# Patient Record
Sex: Male | Born: 1940 | Race: Black or African American | Hispanic: No | Marital: Married | Smoking: Former smoker
Health system: Southern US, Community
[De-identification: ages and names within clinical notes are randomized; demographics above are authoritative.]

## PROBLEM LIST (undated history)

## (undated) DIAGNOSIS — R29898 Other symptoms and signs involving the musculoskeletal system: Secondary | ICD-10-CM

## (undated) DIAGNOSIS — G459 Transient cerebral ischemic attack, unspecified: Secondary | ICD-10-CM

## (undated) DIAGNOSIS — F0280 Dementia in other diseases classified elsewhere without behavioral disturbance: Secondary | ICD-10-CM

## (undated) DIAGNOSIS — R32 Unspecified urinary incontinence: Secondary | ICD-10-CM

## (undated) DIAGNOSIS — F32A Depression, unspecified: Secondary | ICD-10-CM

## (undated) DIAGNOSIS — K219 Gastro-esophageal reflux disease without esophagitis: Secondary | ICD-10-CM

## (undated) DIAGNOSIS — H269 Unspecified cataract: Secondary | ICD-10-CM

## (undated) DIAGNOSIS — I10 Essential (primary) hypertension: Secondary | ICD-10-CM

## (undated) HISTORY — DX: Depression, unspecified: F32.A

## (undated) HISTORY — DX: Transient cerebral ischemic attack, unspecified: G45.9

## (undated) HISTORY — PX: CIRCUMCISION: SUR203

## (undated) HISTORY — PX: OTHER SURGICAL HISTORY: SHX169

## (undated) HISTORY — DX: Unspecified cataract: H26.9

## (undated) HISTORY — DX: Other symptoms and signs involving the musculoskeletal system: R29.898

---

## 2021-02-19 ENCOUNTER — Emergency Department: Payer: Medicare Other

## 2021-02-19 ENCOUNTER — Emergency Department
Admission: EM | Admit: 2021-02-19 | Discharge: 2021-02-19 | Disposition: A | Discharge status: Home / Self Care | Payer: Medicare Other | Attending: Emergency Medicine | Admitting: Emergency Medicine

## 2021-02-19 ENCOUNTER — Encounter: Payer: Self-pay | Admitting: Emergency Medicine

## 2021-02-19 ENCOUNTER — Other Ambulatory Visit: Payer: Self-pay

## 2021-02-19 DIAGNOSIS — Z20822 Contact with and (suspected) exposure to covid-19: Secondary | ICD-10-CM | POA: Insufficient documentation

## 2021-02-19 DIAGNOSIS — F0280 Dementia in other diseases classified elsewhere without behavioral disturbance: Secondary | ICD-10-CM

## 2021-02-19 DIAGNOSIS — R531 Weakness: Secondary | ICD-10-CM

## 2021-02-19 DIAGNOSIS — R410 Disorientation, unspecified: Secondary | ICD-10-CM

## 2021-02-19 DIAGNOSIS — G309 Alzheimer's disease, unspecified: Secondary | ICD-10-CM | POA: Diagnosis not present

## 2021-02-19 DIAGNOSIS — Z87891 Personal history of nicotine dependence: Secondary | ICD-10-CM | POA: Diagnosis not present

## 2021-02-19 HISTORY — DX: Dementia in other diseases classified elsewhere, unspecified severity, without behavioral disturbance, psychotic disturbance, mood disturbance, and anxiety: F02.80

## 2021-02-19 LAB — COMPREHENSIVE METABOLIC PANEL
ALT: 16 U/L (ref 0–44)
AST: 19 U/L (ref 15–41)
Albumin: 3.6 g/dL (ref 3.5–5.0)
Alkaline Phosphatase: 58 U/L (ref 38–126)
Anion gap: 8 (ref 5–15)
BUN: 15 mg/dL (ref 8–23)
CO2: 22 mmol/L (ref 22–32)
Calcium: 9.2 mg/dL (ref 8.9–10.3)
Chloride: 109 mmol/L (ref 98–111)
Creatinine, Ser: 1.73 mg/dL — ABNORMAL HIGH (ref 0.61–1.24)
GFR, Estimated: 39 mL/min — ABNORMAL LOW (ref >60)
Glucose, Bld: 102 mg/dL — ABNORMAL HIGH (ref 70–99)
Potassium: 4.6 mmol/L (ref 3.5–5.1)
Sodium: 139 mmol/L (ref 135–145)
Total Bilirubin: 1.3 mg/dL — ABNORMAL HIGH (ref 0.3–1.2)
Total Protein: 7.9 g/dL (ref 6.5–8.1)

## 2021-02-19 LAB — URINALYSIS, COMPLETE (UACMP) WITH MICROSCOPIC
Bacteria, UA: NONE SEEN
Bilirubin Urine: NEGATIVE
Glucose, UA: NEGATIVE mg/dL
Hgb urine dipstick: NEGATIVE
Ketones, ur: NEGATIVE mg/dL
Leukocytes,Ua: NEGATIVE
Nitrite: NEGATIVE
Protein, ur: 30 mg/dL — AB
Specific Gravity, Urine: 1.011 (ref 1.005–1.030)
pH: 7 (ref 5.0–8.0)

## 2021-02-19 LAB — CBC
HCT: 39.9 % (ref 39.0–52.0)
Hemoglobin: 13.5 g/dL (ref 13.0–17.0)
MCH: 28 pg (ref 26.0–34.0)
MCHC: 33.8 g/dL (ref 30.0–36.0)
MCV: 82.6 fL (ref 80.0–100.0)
Platelets: 198 10*3/uL (ref 150–400)
RBC: 4.83 MIL/uL (ref 4.22–5.81)
RDW: 13.8 % (ref 11.5–15.5)
WBC: 5.5 10*3/uL (ref 4.0–10.5)
nRBC: 0 % (ref 0.0–0.2)

## 2021-02-19 LAB — RESP PANEL BY RT-PCR (FLU A&B, COVID) ARPGX2
Influenza A by PCR: NEGATIVE
Influenza B by PCR: NEGATIVE
SARS Coronavirus 2 by RT PCR: NEGATIVE

## 2021-02-19 IMAGING — CR DG CHEST 1V
1 series · 1 of 1 positions shown · non-contrast
Comparison: None.

CLINICAL DATA: Weakness

EXAM:
CHEST  1 VIEW

[dg chest 1 view]
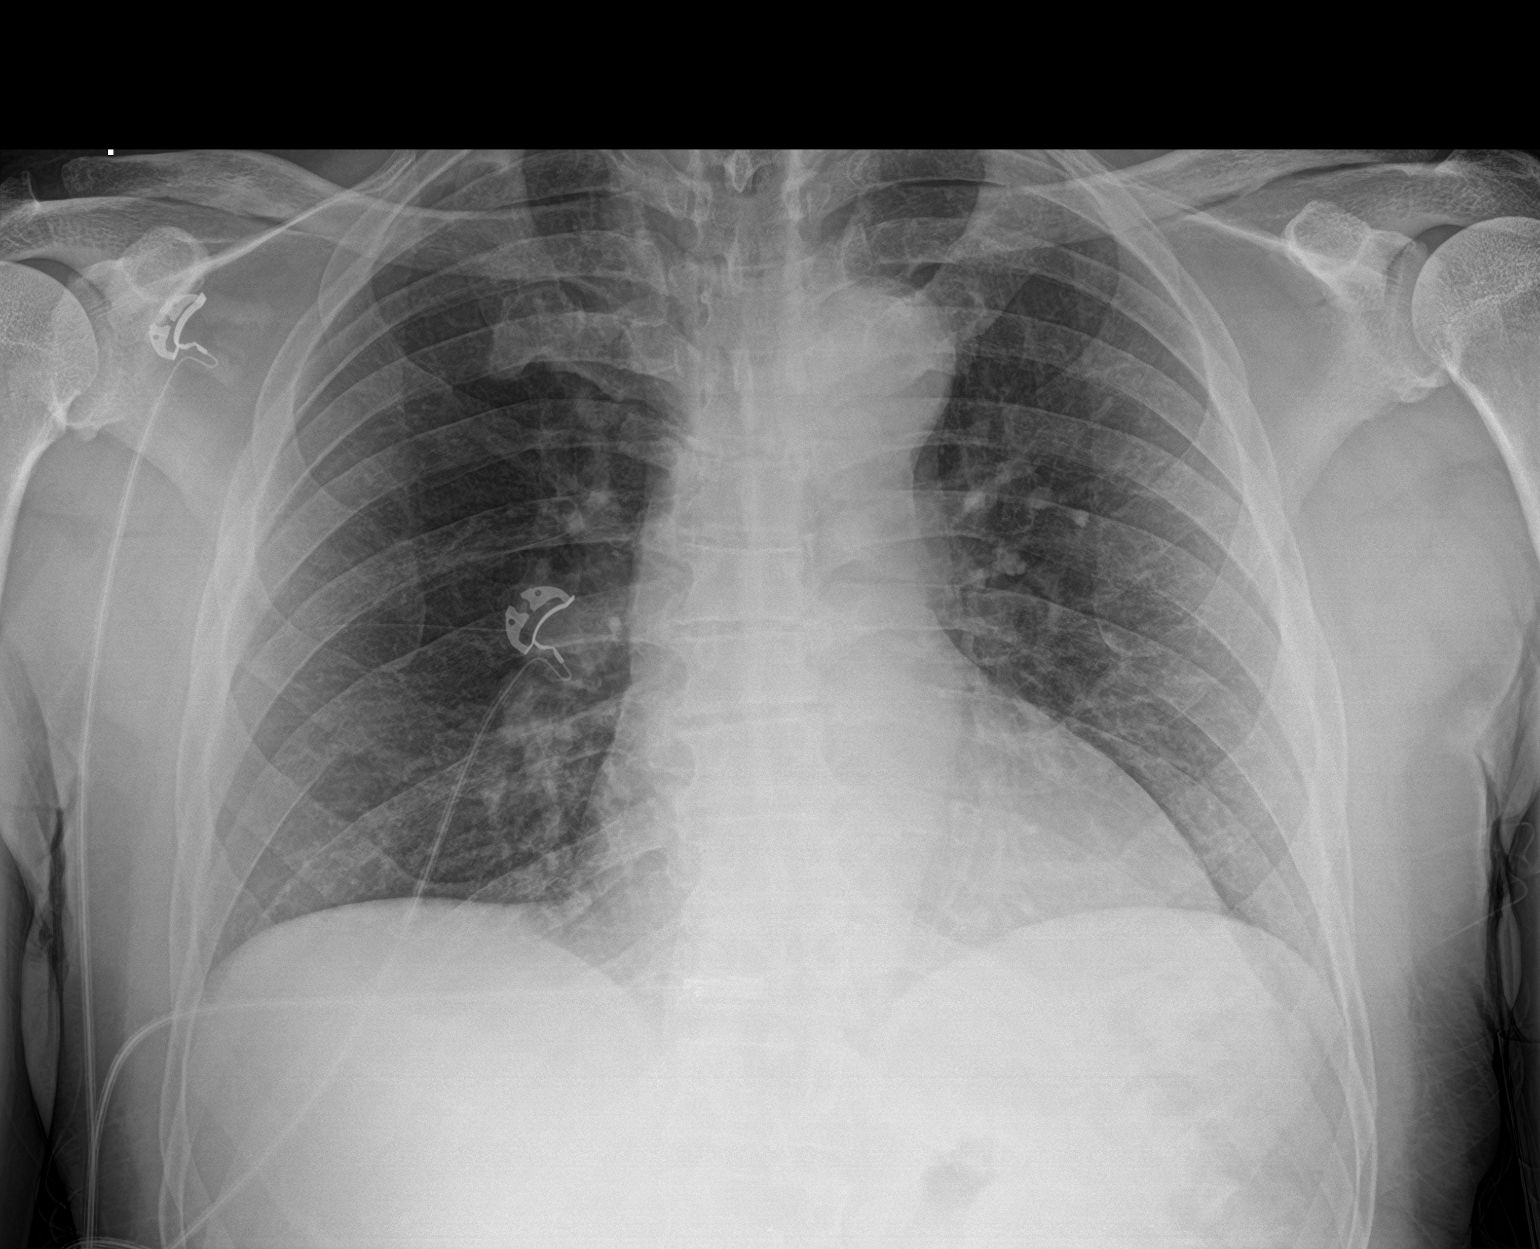

[1 of 1 positions shown; findings below may reference images not displayed]

FINDINGS: The heart size and mediastinal contours are within normal limits.
Both lungs are clear. The visualized skeletal structures are
unremarkable.
IMPRESSION: No acute abnormality of the lungs in AP portable projection.

## 2021-02-19 IMAGING — CT CT HEAD W/O CM
4 series · 16 of 47 positions shown, 18 images · non-contrast
Comparison: None

CLINICAL DATA: Altered mental status beginning yesterday, history
Alzheimer dementia, former smoker

EXAM:
CT HEAD WITHOUT CONTRAST
TECHNIQUE: Contiguous axial images were obtained from the base of the skull
through the vertex without intravenous contrast. Sagittal and
coronal MPR images reconstructed from axial data set.

[Series 2: head wo · axial · 0.43mm/px · z∈[+309,+424]mm · 7 of 31 slices shown, 9 images]
[im 4/31  brain]
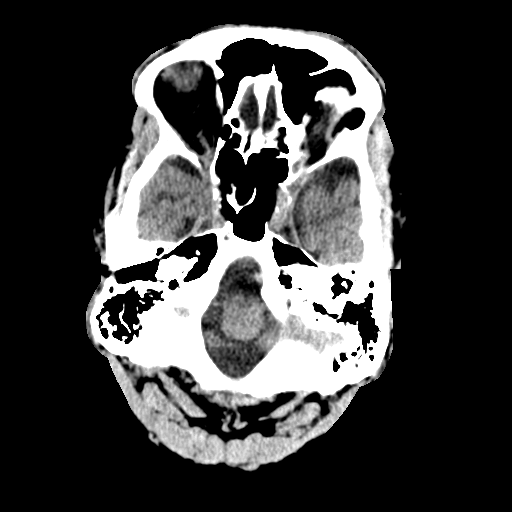
[im 4/31  bone]
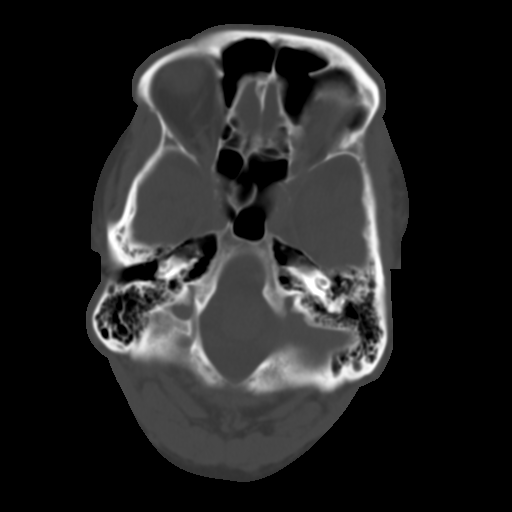
[im 8/31  brain]
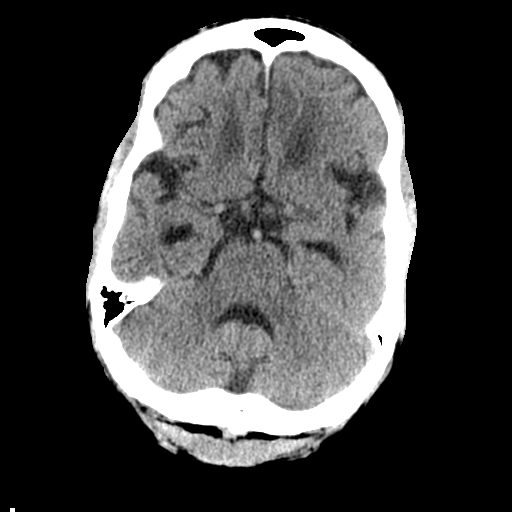
[im 12/31  brain]
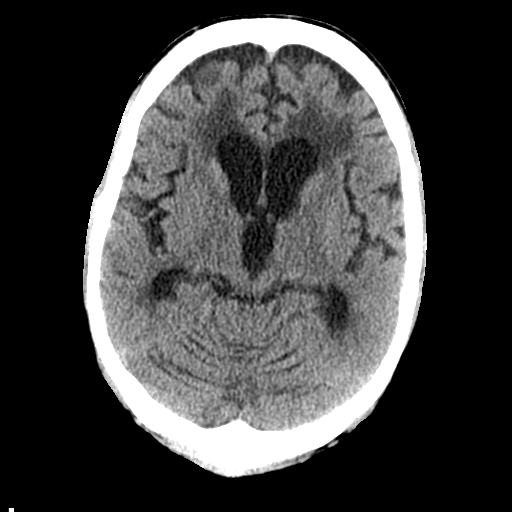
[im 16/31  brain]
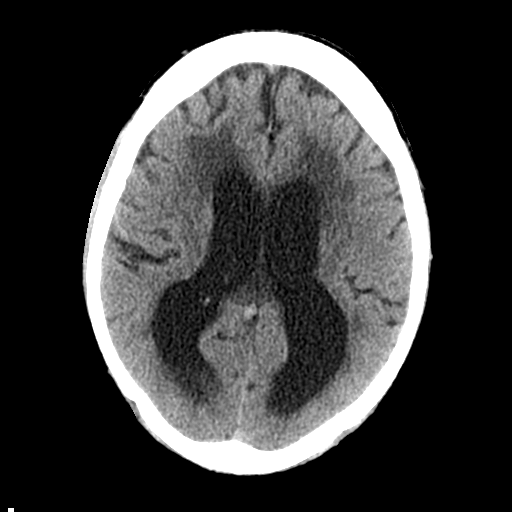
[im 19/31  brain]
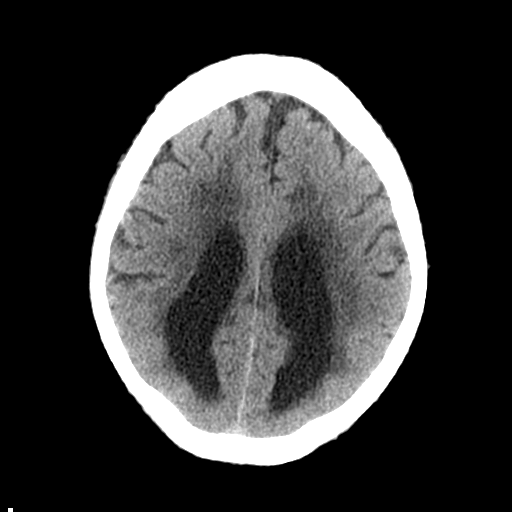
[im 19/31  bone]
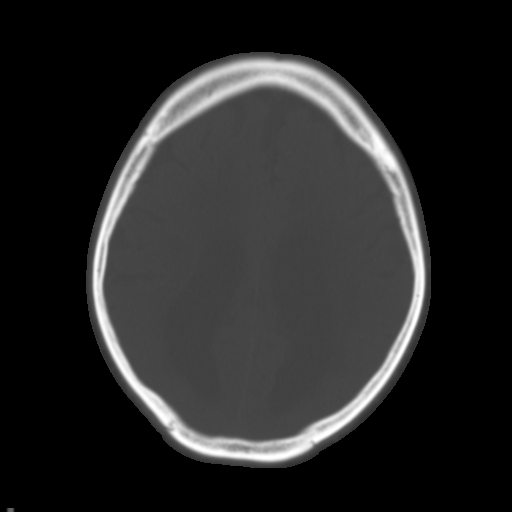
[im 23/31  brain]
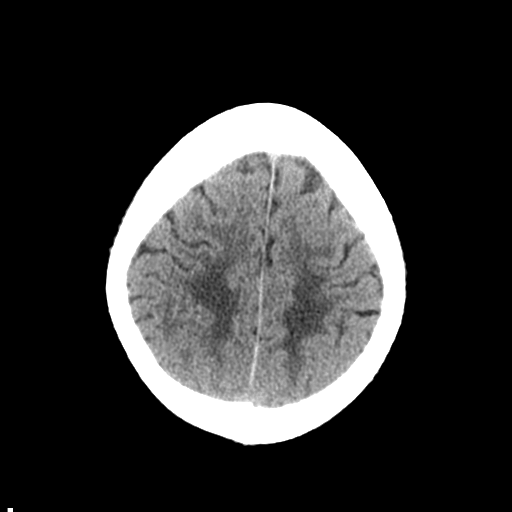
[im 27/31  brain]
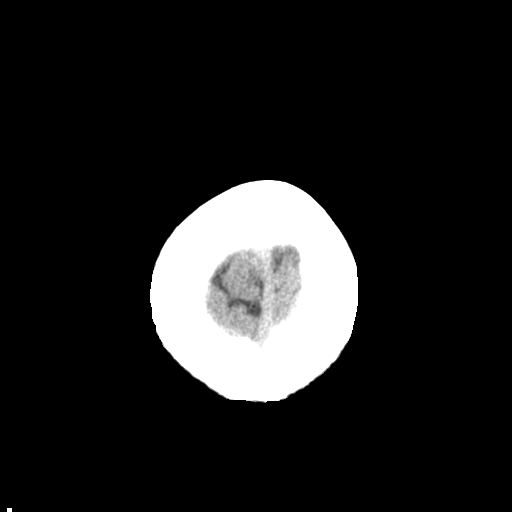

[Series 3: head bone · axial · 0.43mm/px · z∈[+308,+338]mm · 3 of 77 slices shown]
[im 8/77  bone]
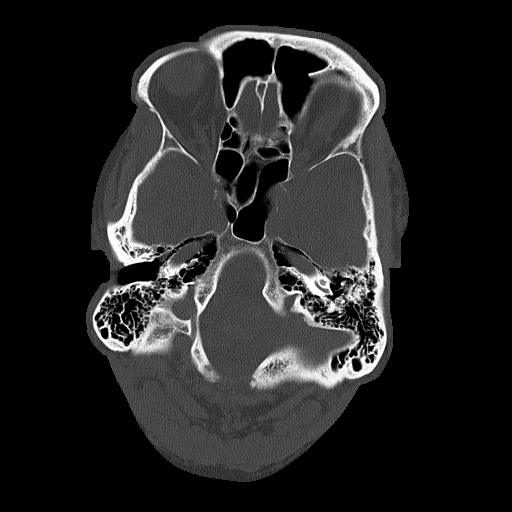
[im 16/77  bone]
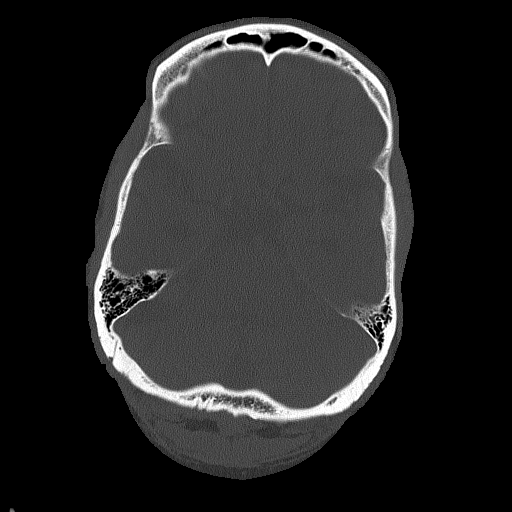
[im 23/77  bone]
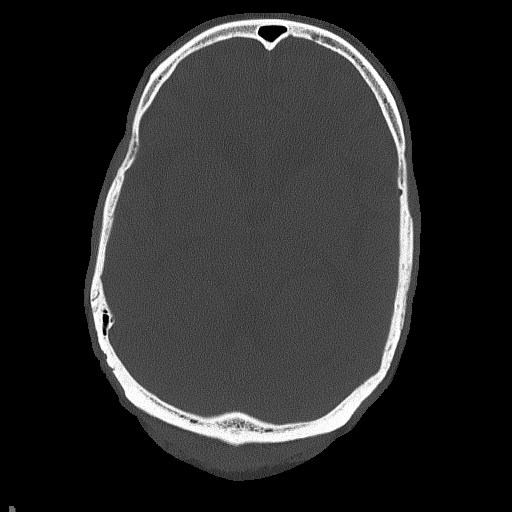

[Series 4: coronal soft tissue · coronal · 0.31mm/px · 3 of 70 slices shown]
[im 24/70  brain]
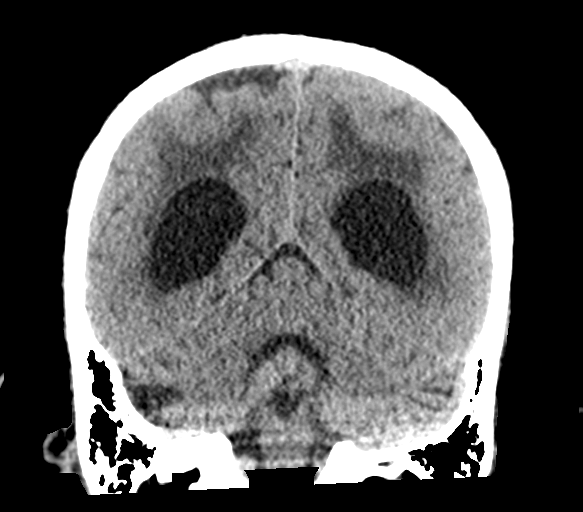
[im 31/70  brain]
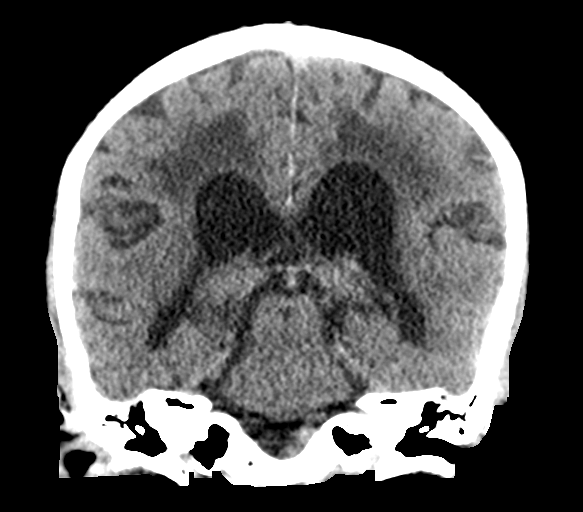
[im 39/70  brain]
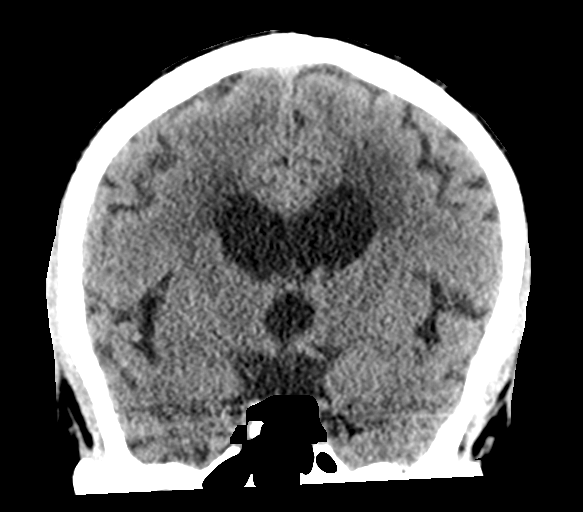

[Series 5: sagittal soft tissue · sagittal · 0.31mm/px · 3 of 55 slices shown]
[im 19/55  brain]
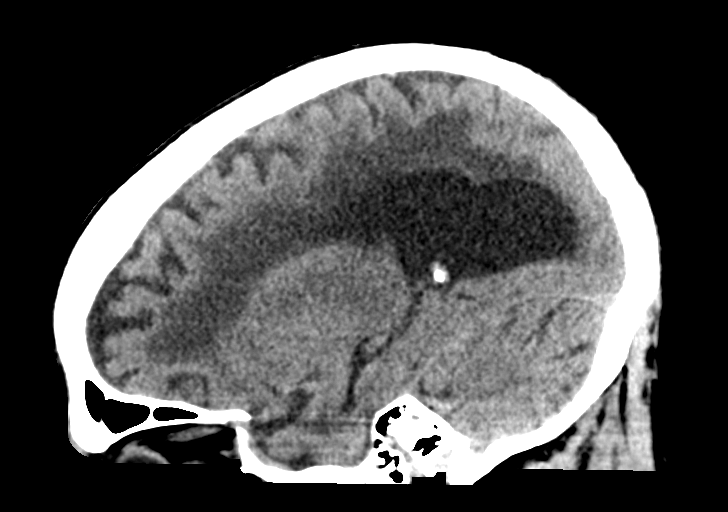
[im 28/55  brain]
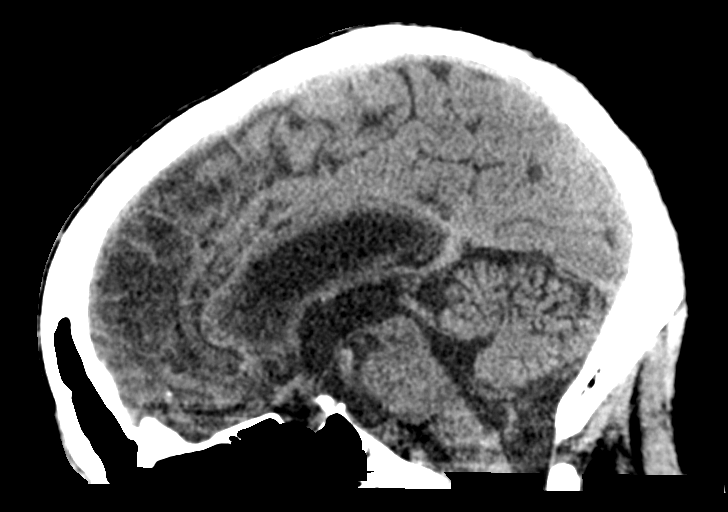
[im 37/55  brain]
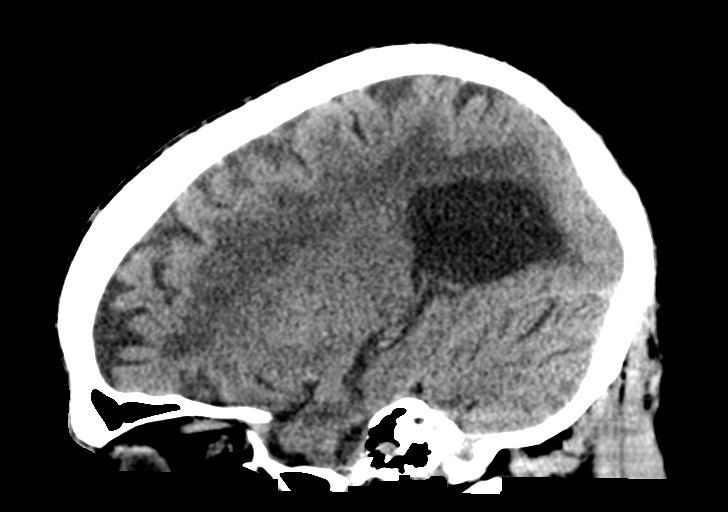

[16 of 47 positions shown; findings below may reference images not displayed]

FINDINGS: Brain: Generalized atrophy. Ex vacuo dilatation of the ventricular
system. No midline shift or mass effect. Extensive small vessel
chronic ischemic changes of deep cerebral white matter. No
intracranial hemorrhage, mass lesion, or evidence of acute
infarction. No extra-axial fluid collections.

Vascular: No hyperdense vessels. Minimal atherosclerotic
calcification of internal carotid arteries at skull base

Skull: Intact

Sinuses/Orbits: Clear

Other: N/A
IMPRESSION: Atrophy with small vessel chronic ischemic changes of deep cerebral
white matter.

No acute intracranial abnormalities.

## 2021-02-19 MED ORDER — SODIUM CHLORIDE 0.9 % IV BOLUS
1000.0000 mL | Freq: Once | INTRAVENOUS | Status: AC
  Administered 2021-02-19: 1000 mL via INTRAVENOUS

## 2021-02-19 MED ORDER — ONDANSETRON 4 MG PO TBDP: 4.0000 mg | ORAL_TABLET | Freq: Three times a day (TID) | ORAL | 0 refills | Status: DC | PRN

## 2021-02-19 NOTE — ED Provider Notes (Signed)
[facility] Emergency Department Provider Note  ____________________________________________  Time seen: Approximately 5:18 PM  I have reviewed the triage vital signs and the nursing notes.   HISTORY  Chief Complaint Altered Mental Status    Level 5 Caveat: Portions of the History and Physical including HPI and review of systems are unable to be completely obtained due to patient chronic dementia  HPI Robert Zamora is a 80 y.o. male with a history of Alzheimer's disease is brought to the ED due to worsening generalized weakness today on top of his chronic weakness and confusion that have been worsening over the past 6 months.  Today he seems more confused.  No falls or trauma.  With assistance he was able to get out of bed and use his cane to go to the bathroom, although he did seem more unsteady on his feet than usual.  Wife also notes that the patient and herself both got their second COVID-vaccine boosters yesterday.      Past Medical History:  Diagnosis Date  . Alzheimer disease ([place])      There are no problems to display for this patient.    Past Surgical History:  Procedure Laterality Date  . CIRCUMCISION       Prior to Admission medications   Not on File     Allergies Patient has no known allergies.   History reviewed. No pertinent family history.  Social History Social History   Tobacco Use  . Smoking status: Former Research scientist (life sciences)  . Smokeless tobacco: Never Used  Substance Use Topics  . Alcohol use: Not Currently    Review of Systems Level 5 Caveat: Portions of the History and Physical including HPI and review of systems are unable to be completely obtained due to patient being a poor historian   Constitutional:   No known fever.  ENT:   No rhinorrhea. Cardiovascular:   No chest pain or syncope. Respiratory:   No dyspnea or cough. Gastrointestinal:   Negative for abdominal pain, vomiting and diarrhea.  Musculoskeletal:    Negative for focal pain or swelling ____________________________________________   PHYSICAL EXAM:  VITAL SIGNS: ED Triage Vitals  Enc Vitals Group     BP 02/19/21 1400 (!) 144/88     Pulse Rate 02/19/21 1400 67     Resp 02/19/21 1400 18     Temp 02/19/21 1400 100 F (37.8 C)     Temp Source 02/19/21 1400 Oral     SpO2 02/19/21 1400 97 %     Weight 02/19/21 1355 200 lb (90.7 kg)     Height 02/19/21 1355 6\' 3"  (1.905 m)     Head Circumference --      Peak Flow --      Pain Score 02/19/21 1355 0     Pain Loc --      Pain Edu? --      Excl. in [place]? --     Vital signs reviewed, nursing assessments reviewed.   Constitutional:   Alert and oriented to self. Non-toxic appearance. Eyes:   Conjunctivae are normal. EOMI. PERRL.  No nystagmus ENT      Head:   Normocephalic and atraumatic.      Nose:   No congestion/rhinnorhea.       Mouth/Throat:   MMM, no pharyngeal erythema. No peritonsillar mass.       Neck:   No meningismus. Full ROM. Hematological/Lymphatic/Immunilogical:   No cervical lymphadenopathy. Cardiovascular:   RRR. Symmetric bilateral radial and DP pulses.  No murmurs. Cap refill less than 2 seconds. Respiratory:   Normal respiratory effort without tachypnea/retractions. Breath sounds are clear and equal bilaterally. No wheezes/rales/rhonchi. Gastrointestinal:   Soft and nontender. Non distended. There is no CVA tenderness.  No rebound, rigidity, or guarding. Genitourinary:   deferred Musculoskeletal:   Normal range of motion in all extremities. No joint effusions.  No lower extremity tenderness.  No edema. Neurologic:   Normal speech, limited language range. Cranial nerves III through XII intact Motor grossly intact.  No drift No acute focal neurologic deficits are appreciated.  Skin:    Skin is warm, dry and intact. No rash noted.  No petechiae, purpura, or bullae.  ____________________________________________    LABS (pertinent positives/negatives) (all labs  ordered are listed, but only abnormal results are displayed) Labs Reviewed  COMPREHENSIVE METABOLIC PANEL - Abnormal; Notable for the following components:      Result Value   Glucose, Bld 102 (*)    Creatinine, Ser 1.73 (*)    Total Bilirubin 1.3 (*)    GFR, Estimated 39 (*)    All other components within normal limits  URINALYSIS, COMPLETE (UACMP) WITH MICROSCOPIC - Abnormal; Notable for the following components:   Color, Urine YELLOW (*)    APPearance HAZY (*)    Protein, ur 30 (*)    All other components within normal limits  RESP PANEL BY RT-PCR (FLU A&B, COVID) ARPGX2  CBC   ____________________________________________   EKG    ____________________________________________    RADIOLOGY  DG Chest 1 View  Result Date: 02/19/2021 CLINICAL DATA:  Weakness EXAM: CHEST  1 VIEW COMPARISON:  None. FINDINGS: The heart size and mediastinal contours are within normal limits. Both lungs are clear. The visualized skeletal structures are unremarkable. IMPRESSION: No acute abnormality of the lungs in AP portable projection. Electronically Signed   By: Eddie Candle M.D.   On: 02/19/2021 16:18   CT Head Wo Contrast  Result Date: 02/19/2021 CLINICAL DATA:  Altered mental status beginning yesterday, history Alzheimer dementia, former smoker EXAM: CT HEAD WITHOUT CONTRAST TECHNIQUE: Contiguous axial images were obtained from the base of the skull through the vertex without intravenous contrast. Sagittal and coronal MPR images reconstructed from axial data set. COMPARISON:  None FINDINGS: Brain: Generalized atrophy. Ex vacuo dilatation of the ventricular system. No midline shift or mass effect. Extensive small vessel chronic ischemic changes of deep cerebral white matter. No intracranial hemorrhage, mass lesion, or evidence of acute infarction. No extra-axial fluid collections. Vascular: No hyperdense vessels. Minimal atherosclerotic calcification of internal carotid arteries at skull base Skull:  Intact Sinuses/Orbits: Clear Other: N/A IMPRESSION: Atrophy with small vessel chronic ischemic changes of deep cerebral white matter. No acute intracranial abnormalities. Electronically Signed   By: Lavonia Dana M.D.   On: 02/19/2021 16:09    ____________________________________________   PROCEDURES Procedures  ____________________________________________  DIFFERENTIAL DIAGNOSIS   Intracranial hemorrhage, pneumonia, UTI, dehydration, electrolyte abnormality, vaccine reaction  CLINICAL IMPRESSION / ASSESSMENT AND PLAN / ED COURSE  Medications ordered in the ED: Medications  sodium chloride 0.9 % bolus 1,000 mL (1,000 mLs Intravenous New Bag/Given 02/19/21 1619)    Pertinent labs & imaging results that were available during my care of the patient were reviewed by me and considered in my medical decision making (see chart for details).   Robert Zamora was evaluated in Emergency Department on 02/19/2021 for the symptoms described in the history of present illness. He was evaluated in the context of the global COVID-19 pandemic, which necessitated  consideration that the patient might be at risk for infection with the SARS-CoV-2 virus that causes COVID-19. Institutional protocols and algorithms that pertain to the evaluation of patients at risk for COVID-19 are in a state of rapid change based on information released by regulatory bodies including the CDC and federal and state organizations. These policies and algorithms were followed during the patient's care in the ED.   Patient presents with acute on chronic generalized weakness and confusion in the setting of progressing dementia.  No other recent symptoms, seem to be in his baseline state of health until waking up this morning.  Vital signs are unremarkable, exam is reassuring.  Labs reassuring.  No prior creatinine available in EMR for comparison, doubt acute renal insufficiency given that he does not appear severely dehydrated on exam.  Chest  x-ray and CT head unremarkable.  Urinalysis negative.  No evidence of urinary retention.  COVID and flu negative.  I believe his symptoms are due to immune response to his second booster vaccine.  Stable for discharge home with supportive care, expect he will return to baseline within the next day or 2.      ____________________________________________   FINAL CLINICAL IMPRESSION(S) / ED DIAGNOSES    Final diagnoses:  Generalized weakness  Confusion  Alzheimer's dementia without behavioral disturbance, unspecified timing of dementia onset [facility])     ED Discharge Orders    None      Portions of this note were generated with dragon dictation software. Dictation errors may occur despite best attempts at proofreading.   Carrie Mew, MD 02/19/21 1724

## 2021-02-19 NOTE — ED Notes (Signed)
In & Out cath completed - sterile technique. Urine sample obtained and sent to lab. Drained 300 mls urine. Patient to CT scan.

## 2021-02-19 NOTE — ED Notes (Signed)
All documentation completed by Ricke Hey, SRN. Reviewed and approved by this RN.

## 2021-02-19 NOTE — Discharge Instructions (Addendum)
Your tests today were all reassuring.  No sign of urinary tract infection or pneumonia.  Labs were okay.  CT scan of the head did not reveal any acute issues.  I suspect your symptoms are due to the body's reaction to your COVID-vaccine from yesterday.  Try taking Tylenol 1000 mg 3 times a day for the next 2 days.  Nausea medicine may also help improve eating.  I expect today's symptoms to resolve over the next 1 to 2 days.

## 2021-02-19 NOTE — ED Triage Notes (Signed)
Pt comes into the ED via POV c/o AMS that started yesterday.  Per the wife, he struggled getting out of the bed this morning d/t weakness but also confusion of whether or not he wanted to.  Pt has incontinence at h/o dementia. PT's wife is concerned about a UTI. Pt does have a stronger odor present to his urine.  Pt currently in NAD at this time with even and unlabored respirations.

## 2021-04-01 ENCOUNTER — Telehealth (INDEPENDENT_AMBULATORY_CARE_PROVIDER_SITE_OTHER): Payer: Self-pay

## 2021-04-02 ENCOUNTER — Other Ambulatory Visit (INDEPENDENT_AMBULATORY_CARE_PROVIDER_SITE_OTHER): Payer: Self-pay | Admitting: Nurse Practitioner

## 2021-04-02 DIAGNOSIS — R6889 Other general symptoms and signs: Secondary | ICD-10-CM

## 2021-04-02 NOTE — Telephone Encounter (Signed)
Documentation only.

## 2021-04-03 ENCOUNTER — Other Ambulatory Visit: Payer: Self-pay

## 2021-04-03 ENCOUNTER — Ambulatory Visit (INDEPENDENT_AMBULATORY_CARE_PROVIDER_SITE_OTHER): Payer: Medicare Other

## 2021-04-03 ENCOUNTER — Encounter (INDEPENDENT_AMBULATORY_CARE_PROVIDER_SITE_OTHER): Payer: Self-pay | Admitting: Nurse Practitioner

## 2021-04-03 ENCOUNTER — Ambulatory Visit (INDEPENDENT_AMBULATORY_CARE_PROVIDER_SITE_OTHER): Payer: Medicare Other | Admitting: Nurse Practitioner

## 2021-04-03 VITALS — BP 170/89 | HR 67 | Ht 75.0 in | Wt 231.0 lb

## 2021-04-03 DIAGNOSIS — R2681 Unsteadiness on feet: Secondary | ICD-10-CM | POA: Diagnosis not present

## 2021-04-03 DIAGNOSIS — R6889 Other general symptoms and signs: Secondary | ICD-10-CM

## 2021-04-05 NOTE — Progress Notes (Signed)
Subjective:    Patient ID: Robert Zamora, male    DOB: 1940-12-10, 80 y.o.   MRN: [id number] Chief Complaint  Patient presents with  . New Patient (Initial Visit)    New patient and consult . H/O abnormal ABI  with moderate LLE dz. Referred by fitzgerald     Bram Hottel is an 80 year old male that presents today as a referral from Dr. Ola Spurr in regards to abnormal ABIs.  The patient recently relocated from [place].  The patient's wife presents much of the history.  The patient does have a history of Alzheimer's disease.  She notes that he has been diagnosed with cauda equina and that he has some depth perception issues.  She does not note mention of any vascular issues however she notes that he was told he has "thick blood".  He denies any claudication-like issues or rest pain.  He denies any fevers or chills or open wounds or ulcerations.  Today noninvasive studies show an ABI of 1.11 on the right and 1.18 on the left.  The patient has a TBI of 1.06 on the right and 0.75 on the left.  The patient has triphasic tibial artery waveforms bilaterally with good toe waveforms bilaterally.  No evidence of significant lower extremity arterial disease seen bilaterally.   Review of Systems  Musculoskeletal: Positive for gait problem.  Skin: Negative for wound.  All other systems reviewed and are negative.      Objective:   Physical Exam Vitals reviewed.  HENT:     Head: Normocephalic.  Cardiovascular:     Rate and Rhythm: Normal rate and regular rhythm.     Pulses: Normal pulses.  Pulmonary:     Effort: Pulmonary effort is normal.  Neurological:     Mental Status: He is alert. Mental status is at baseline.     Gait: Gait abnormal.  Psychiatric:        Mood and Affect: Mood normal.        Behavior: Behavior normal.        Thought Content: Thought content normal.        Judgment: Judgment normal.     BP (!) 170/89   Pulse 67   Ht 6\' 3"  (1.905 m)   Wt 231 lb (104.8 kg)   BMI  28.87 kg/m   Past Medical History:  Diagnosis Date  . Alzheimer disease [facility])     Social History   Socioeconomic History  . Marital status: Married    Spouse name: Not on file  . Number of children: Not on file  . Years of education: Not on file  . Highest education level: Not on file  Occupational History  . Not on file  Tobacco Use  . Smoking status: Former Research scientist (life sciences)  . Smokeless tobacco: Never Used  Substance and Sexual Activity  . Alcohol use: Not Currently  . Drug use: Not on file  . Sexual activity: Not on file  Other Topics Concern  . Not on file  Social History Narrative  . Not on file   Social Determinants of Health   Financial Resource Strain: Not on file  Food Insecurity: Not on file  Transportation Needs: Not on file  Physical Activity: Not on file  Stress: Not on file  Social Connections: Not on file  Intimate Partner Violence: Not on file    Past Surgical History:  Procedure Laterality Date  . CIRCUMCISION      History reviewed. No pertinent family history.  No Known  Allergies  CBC Latest Ref Rng & Units 02/19/2021  WBC 4.0 - 10.5 K/uL 5.5  Hemoglobin 13.0 - 17.0 g/dL 13.5  Hematocrit 39.0 - 52.0 % 39.9  Platelets 150 - 400 K/uL 198      CMP     Component Value Date/Time   NA 139 02/19/2021 1406   K 4.6 02/19/2021 1406   CL 109 02/19/2021 1406   CO2 22 02/19/2021 1406   GLUCOSE 102 (H) 02/19/2021 1406   BUN 15 02/19/2021 1406   CREATININE 1.73 (H) 02/19/2021 1406   CALCIUM 9.2 02/19/2021 1406   PROT 7.9 02/19/2021 1406   ALBUMIN 3.6 02/19/2021 1406   AST 19 02/19/2021 1406   ALT 16 02/19/2021 1406   ALKPHOS 58 02/19/2021 1406   BILITOT 1.3 (H) 02/19/2021 1406   GFRNONAA 39 (L) 02/19/2021 1406     No results found.     Assessment & Plan:   1. Abnormal ankle brachial index (ABI) Today the patient's noninvasive studies show normal ABIs with strong triphasic tibial artery waveforms and good toe waveforms bilaterally.  Because  the patient recently relocated it is unsure if these abnormal ABIs or from previous medical records or if it possibly came from a home health screening from insurance companies.  However in either case the patient's ABIs are normal and no further follow-up is recommended at this time.  The patient is advised that if other vascular issues arise or if there is concern that this may change to contact our office and we will be happy to reevaluate.  2. Unsteady gait The patient does have unsteady gait.  In conjunction with the patient's ABIs today as well as the history his wife gave, such as known cauda equina syndrome, this is the possible likely cause for continued unsteadiness.  The patient also has some depth perception issues which certainly can affect his gait.  The patient has upcoming appointment with neurology for further evaluation.   Current Outpatient Medications on File Prior to Visit  Medication Sig Dispense Refill  . aspirin 81 MG EC tablet Take by mouth.    Marland Kitchen buPROPion (WELLBUTRIN XL) 150 MG 24 hr tablet Take 1 tablet by mouth every morning.    . donepezil (ARICEPT) 10 MG tablet Take 1 tablet by mouth daily.    . memantine (NAMENDA) 10 MG tablet Take by mouth.    . olmesartan (BENICAR) 40 MG tablet Take by mouth.    . ondansetron (ZOFRAN ODT) 4 MG disintegrating tablet Take 1 tablet (4 mg total) by mouth every 8 (eight) hours as needed for nausea or vomiting. 20 tablet 0  . tamsulosin (FLOMAX) 0.4 MG CAPS capsule Take 0.4 mg by mouth.     No current facility-administered medications on file prior to visit.    There are no Patient Instructions on file for this visit. No follow-ups on file.   Kris Hartmann, NP

## 2021-04-14 DIAGNOSIS — N401 Enlarged prostate with lower urinary tract symptoms: Secondary | ICD-10-CM | POA: Insufficient documentation

## 2021-04-14 DIAGNOSIS — I10 Essential (primary) hypertension: Secondary | ICD-10-CM | POA: Insufficient documentation

## 2021-04-14 DIAGNOSIS — I739 Peripheral vascular disease, unspecified: Secondary | ICD-10-CM | POA: Insufficient documentation

## 2021-04-14 DIAGNOSIS — R258 Other abnormal involuntary movements: Secondary | ICD-10-CM | POA: Insufficient documentation

## 2021-05-05 ENCOUNTER — Other Ambulatory Visit: Payer: Self-pay | Admitting: Neurology

## 2021-05-05 DIAGNOSIS — G309 Alzheimer's disease, unspecified: Secondary | ICD-10-CM

## 2021-05-05 DIAGNOSIS — F0150 Vascular dementia without behavioral disturbance: Secondary | ICD-10-CM

## 2021-05-15 ENCOUNTER — Other Ambulatory Visit: Payer: Self-pay

## 2021-05-15 ENCOUNTER — Ambulatory Visit
Admission: RE | Admit: 2021-05-15 | Discharge: 2021-05-15 | Disposition: A | Discharge status: Home / Self Care | Payer: Medicare Other | Source: Ambulatory Visit | Attending: Neurology | Admitting: Neurology

## 2021-05-15 DIAGNOSIS — F0150 Vascular dementia without behavioral disturbance: Secondary | ICD-10-CM | POA: Diagnosis present

## 2021-05-15 DIAGNOSIS — F0280 Dementia in other diseases classified elsewhere without behavioral disturbance: Secondary | ICD-10-CM | POA: Diagnosis present

## 2021-05-15 DIAGNOSIS — G309 Alzheimer's disease, unspecified: Secondary | ICD-10-CM | POA: Insufficient documentation

## 2021-05-15 IMAGING — MR MR HEAD W/O CM
11 of 12 series · 39 of 48 positions shown · non-contrast
Comparison: Head CT [DATE].

CLINICAL DATA: 80-year-old male with progressive unsteady gait
since [AN]. Short-term memory loss.

EXAM:
MRI HEAD WITHOUT CONTRAST
TECHNIQUE: Multiplanar, multiecho pulse sequences of the brain and surrounding
structures were obtained without intravenous contrast.

[Series 5: ax dwi_tracew · axial · 3.0mm · 0.65mm/px · z∈[-90,+64]mm · 4 of 48 slices shown]
[im 1/48]
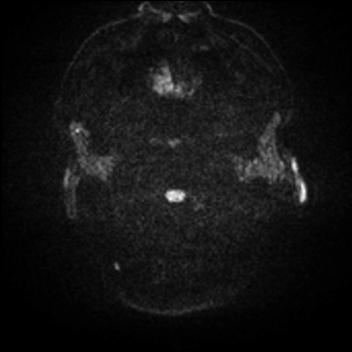
[im 16/48]
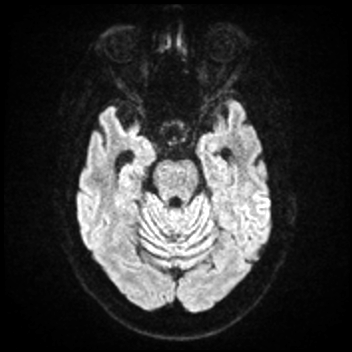
[im 32/48]
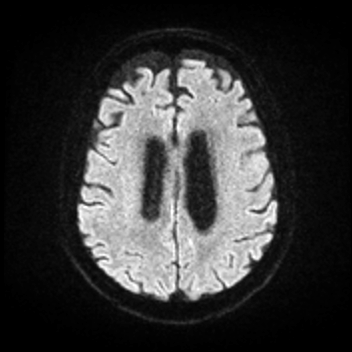
[im 48/48]
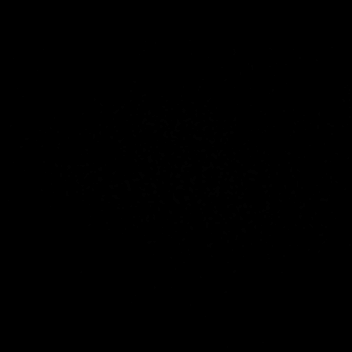

[Series 6: ax dwi_adc · axial · 3.0mm · 0.65mm/px · z∈[-90,+61]mm · 4 of 47 slices shown]
[im 1/47]
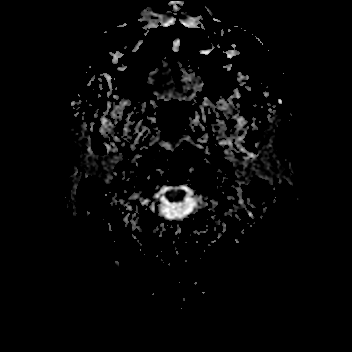
[im 16/47]
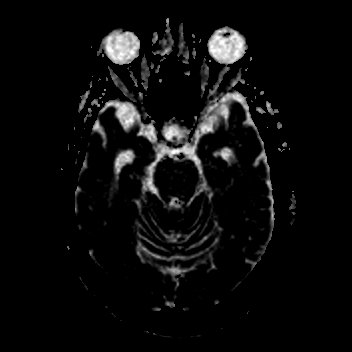
[im 31/47]
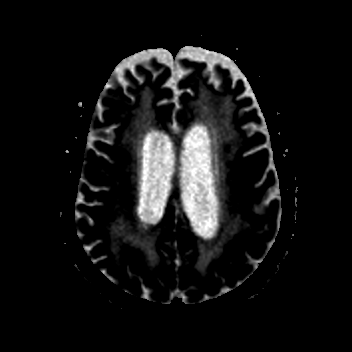
[im 47/47]
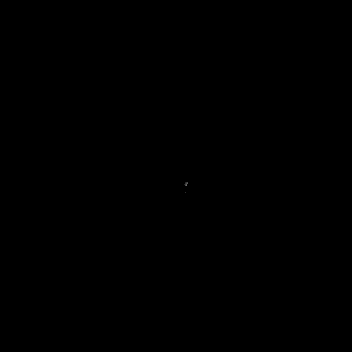

[Series 7: cor dwi_tracew · coronal · 5.0mm · 0.60mm/px · 3 of 40 slices shown]
[im 1/40]
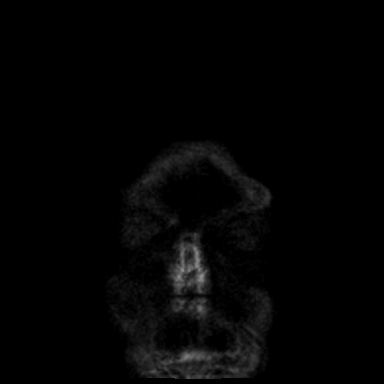
[im 20/40]
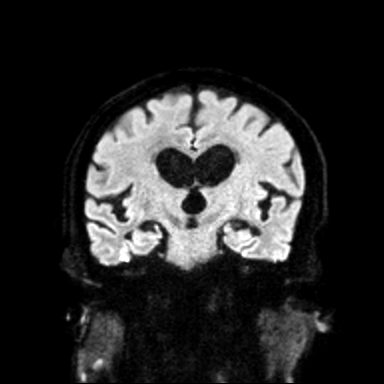
[im 40/40]
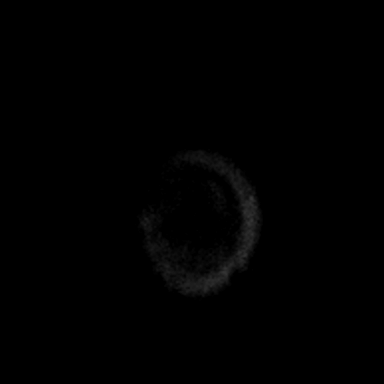

[Series 8: cor dwi_adc · coronal · 5.0mm · 0.60mm/px · 3 of 39 slices shown]
[im 1/39]
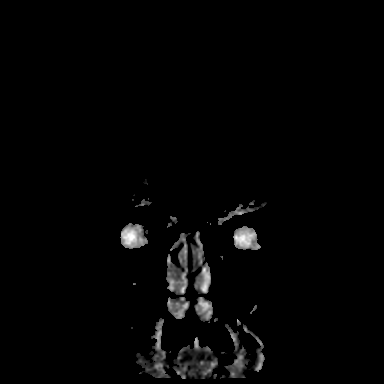
[im 20/39]
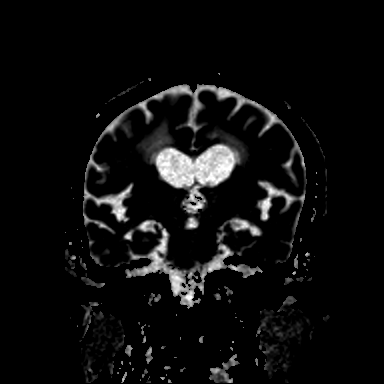
[im 39/39]
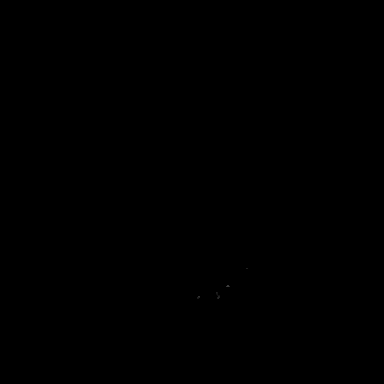

[Series 9: T1 · sagittal · 5.0mm · 0.62mm/px · 2 of 23 slices shown (1 of 2)]
[im 1/23]
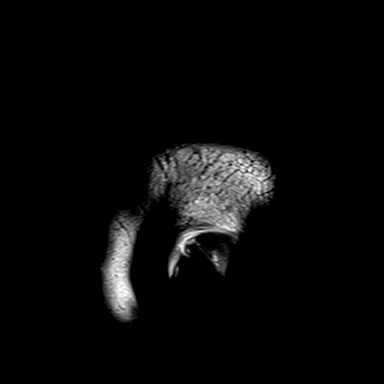
[im 23/23]
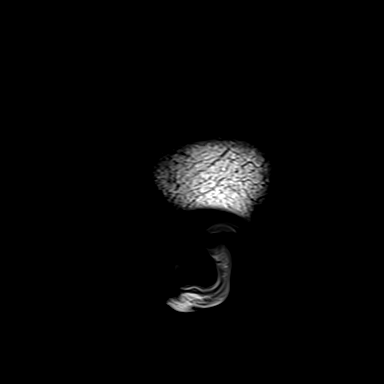

[Series 10: T2 · axial · 5.0mm · 0.53mm/px · z∈[-84,+59]mm · 2 of 25 slices shown (1 of 3)]
[im 1/25]
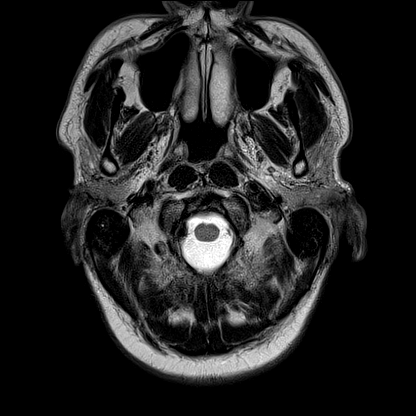
[im 25/25]
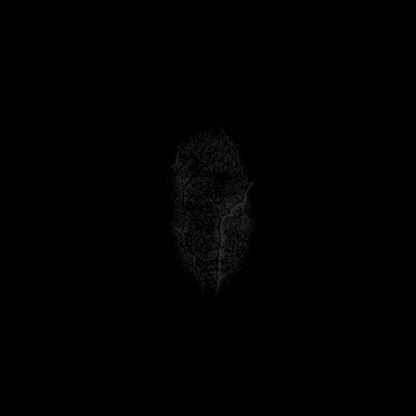

[Series 12: pha_images · axial · 3.0mm · 0.90mm/px · z∈[-101,+72]mm · 5 of 59 slices shown]
[im 1/59]
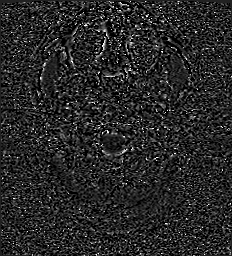
[im 15/59]
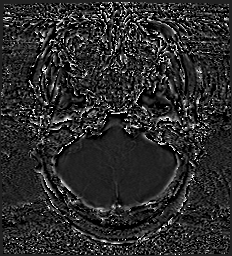
[im 30/59]
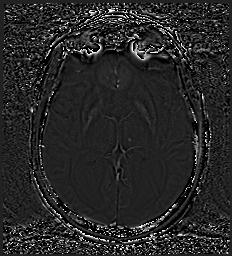
[im 44/59]
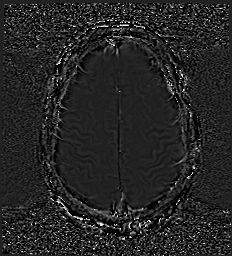
[im 59/59]
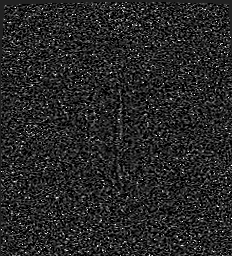

[Series 15: FLAIR · axial · 3.0mm · 0.53mm/px · z∈[-93,+68]mm · 4 of 55 slices shown]
[im 1/55]
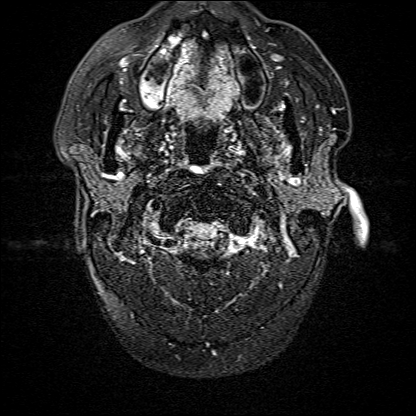
[im 19/55]
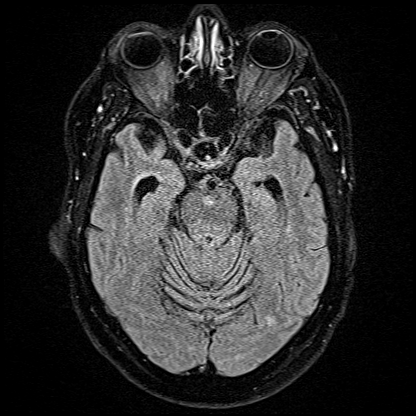
[im 37/55]
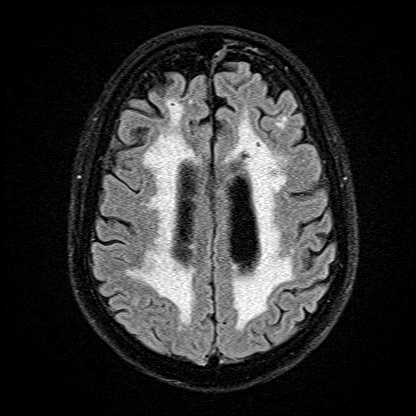
[im 55/55]
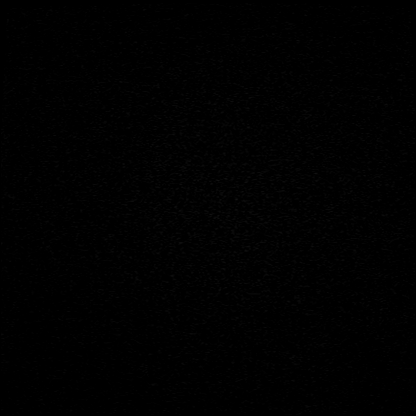

[Series 16: T1 · axial · 1.0mm · 0.98mm/px · z∈[-93,+64]mm · 8 of 159 slices shown (2 of 2)]
[im 1/159]
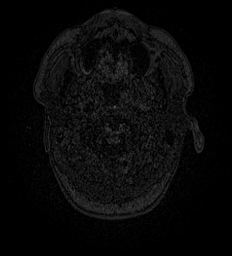
[im 29/159]
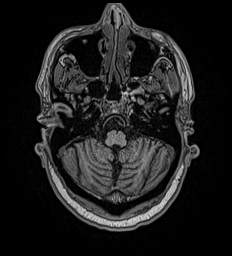
[im 44/159]
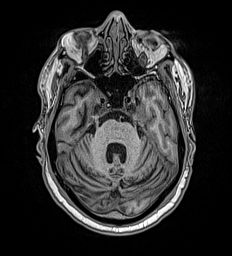
[im 72/159]
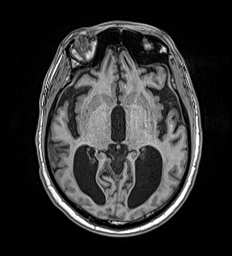
[im 87/159]
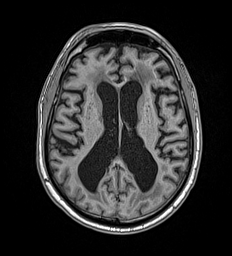
[im 115/159]
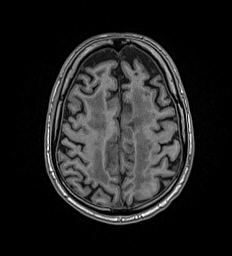
[im 130/159]
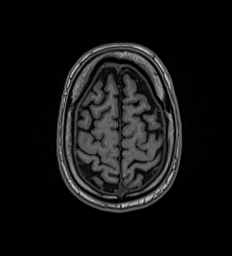
[im 159/159]
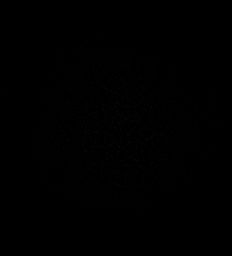

[Series 17: T2 · coronal · 5.0mm · 0.57mm/px · 2 of 31 slices shown (2 of 3)]
[im 1/31]
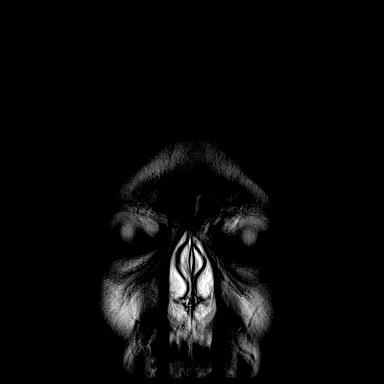
[im 31/31]
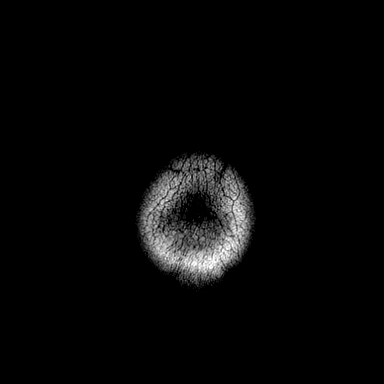

[Series 18: T2 · coronal · 5.0mm · 0.45mm/px · 2 of 32 slices shown (3 of 3)]
[im 1/32]
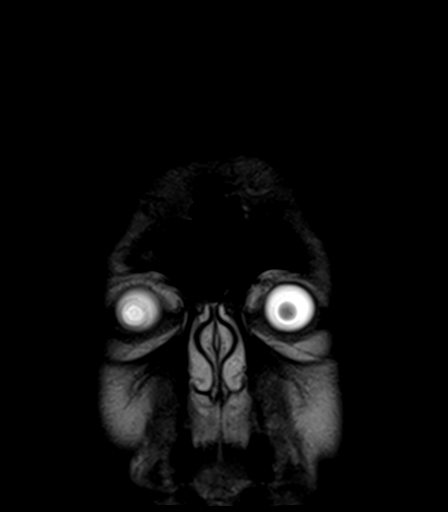
[im 32/32]
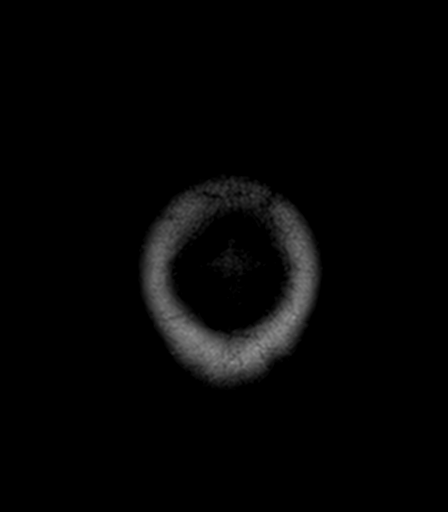

[39 of 48 positions shown; findings below may reference images not displayed]

FINDINGS: Brain: No restricted diffusion to suggest acute infarction. No
midline shift, mass effect, evidence of mass lesion, extra-axial
collection or acute intracranial hemorrhage. Cervicomedullary
junction and pituitary are within normal limits.

Symmetric ventriculomegaly is stable since [place] and nonspecific.
Thinning of the corpus callosum. There does appear to be
hyperdynamic flow at the cerebral aqueduct.

Superimposed confluent nonspecific bilateral cerebral white matter
T2 and FLAIR hyperintensity, most pronounced in the frontal and
parietal lobes although also with occipital and temporal
periventricular involvement. Chronic microhemorrhage in the left
thalamus. In addition there is symmetric patchy bilateral thalamic
T2 and FLAIR hyperintensity. No definite cortical encephalomalacia.
Other deep gray matter nuclei are within normal limits. Minimal T2
heterogeneity in the pons.

Suggestion of mid brain volume loss on sagittal series 9, image 12.
No other disproportionate cerebral volume loss identified.

Vascular: Major intracranial vascular flow voids are preserved with
dominant right vertebral artery and generalized intracranial artery
tortuosity.

Skull and upper cervical spine: Negative for age visible cervical
spine. Visualized bone marrow signal is within normal limits.

Sinuses/Orbits: Postoperative changes to the right globe, otherwise
negative.

Other: Mastoids are clear. Visible internal auditory structures
appear normal. Negative visible scalp and face.
IMPRESSION: 1. No acute intracranial abnormality.
2. Stable ventriculomegaly since [place], nonspecific. Consider normal
pressure hydrocephalus (NPH) in this setting.
3. Suggestion of mid brain volume loss. Advanced cerebral white
matter signal changes, with similar patchy T2 hyperintensity in the
thalami, and a solitary chronic microhemorrhage in the left
thalamus.

## 2021-06-16 ENCOUNTER — Ambulatory Visit (INDEPENDENT_AMBULATORY_CARE_PROVIDER_SITE_OTHER): Payer: Medicare Other | Admitting: Podiatry

## 2021-06-16 ENCOUNTER — Other Ambulatory Visit: Payer: Self-pay

## 2021-06-16 DIAGNOSIS — M79674 Pain in right toe(s): Secondary | ICD-10-CM

## 2021-06-16 DIAGNOSIS — B351 Tinea unguium: Secondary | ICD-10-CM | POA: Diagnosis not present

## 2021-06-16 DIAGNOSIS — M79675 Pain in left toe(s): Secondary | ICD-10-CM

## 2021-06-16 NOTE — Progress Notes (Signed)
   SUBJECTIVE Patient presents to office today complaining of elongated, thickened nails that cause pain while ambulating in shoes.  Patient is unable to trim their own nails. Patient is here for further evaluation and treatment.  Past Medical History:  Diagnosis Date   Alzheimer disease ([place])     OBJECTIVE General Patient is awake, alert, and oriented x 3 and in no acute distress. Derm Skin is dry and supple bilateral. Negative open lesions or macerations. Remaining integument unremarkable. Nails are tender, long, thickened and dystrophic with subungual debris, consistent with onychomycosis, 1-5 bilateral. No signs of infection noted. Vasc  DP and PT pedal pulses palpable bilaterally. Temperature gradient within normal limits.  Neuro Epicritic and protective threshold sensation grossly intact bilaterally.  Musculoskeletal Exam No symptomatic pedal deformities noted bilateral. Muscular strength within normal limits.  ASSESSMENT 1.  Pain due to onychomycosis of toenails both  PLAN OF CARE 1. Patient evaluated today.  2. Instructed to maintain good pedal hygiene and foot care.  3. Mechanical debridement of nails 1-5 bilaterally performed using a nail nipper. Filed with dremel without incident.  4. Return to clinic in 3 mos.    Edrick Kins, DPM [facility]  Dr. Edrick Kins, DPM    [address], [place] [address]                Office [phone]  Fax [phone]

## 2021-06-29 ENCOUNTER — Ambulatory Visit: Payer: Medicare Other | Admitting: Podiatry

## 2021-08-04 ENCOUNTER — Other Ambulatory Visit: Payer: Self-pay | Admitting: Neurosurgery

## 2021-08-04 ENCOUNTER — Other Ambulatory Visit: Payer: Medicare Other

## 2021-08-04 DIAGNOSIS — R296 Repeated falls: Secondary | ICD-10-CM

## 2021-08-05 ENCOUNTER — Other Ambulatory Visit: Payer: Self-pay

## 2021-08-05 ENCOUNTER — Ambulatory Visit
Admission: RE | Admit: 2021-08-05 | Discharge: 2021-08-05 | Disposition: A | Discharge status: Home / Self Care | Payer: Medicare Other | Source: Ambulatory Visit | Attending: Neurosurgery | Admitting: Neurosurgery

## 2021-08-05 DIAGNOSIS — R296 Repeated falls: Secondary | ICD-10-CM | POA: Insufficient documentation

## 2021-08-05 IMAGING — CT CT HEAD W/O CM
1 series · 16 of 30 positions shown, 20 images · non-contrast
Comparison: [DATE]

CLINICAL DATA: Fall

EXAM:
CT HEAD WITHOUT CONTRAST
TECHNIQUE: Contiguous axial images were obtained from the base of the skull
through the vertex without intravenous contrast.

[Series 2: head wo · axial · 0.49mm/px · z∈[-60,+85]mm · 16 of 33 slices shown, 20 images]
[im 2/33  brain]
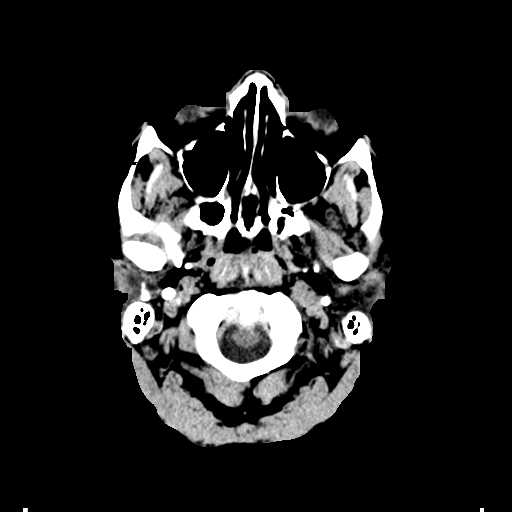
[im 2/33  bone]
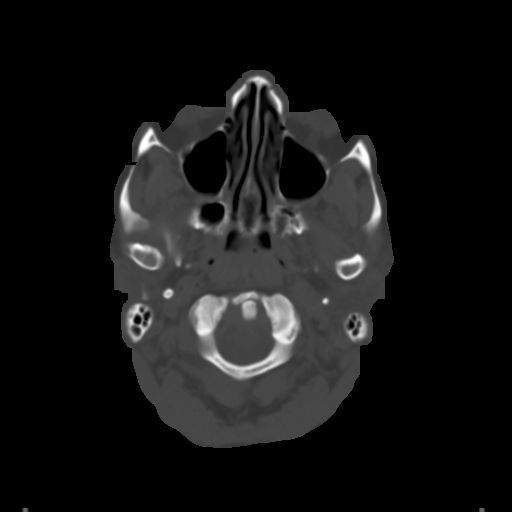
[im 4/33  brain]
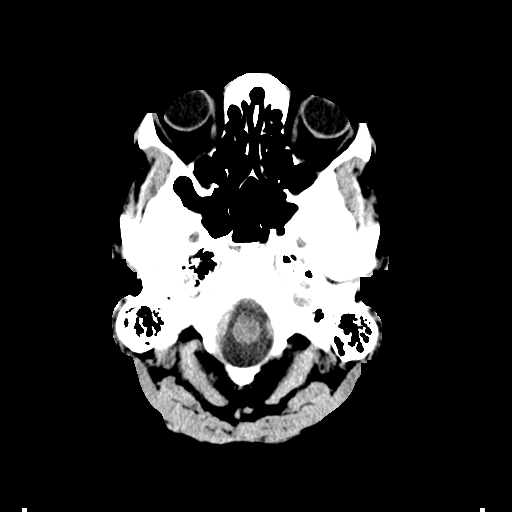
[im 6/33  brain]
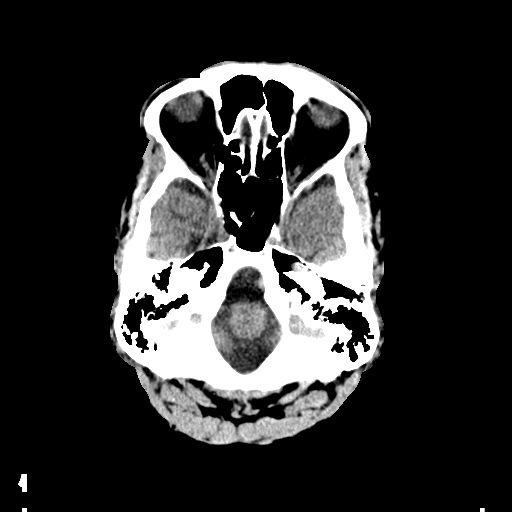
[im 8/33  brain]
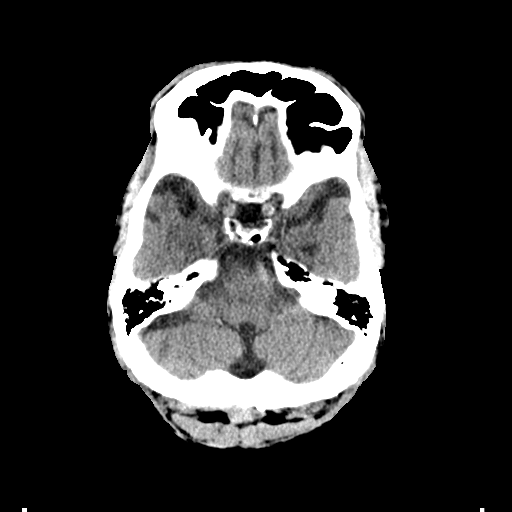
[im 9/33  brain]
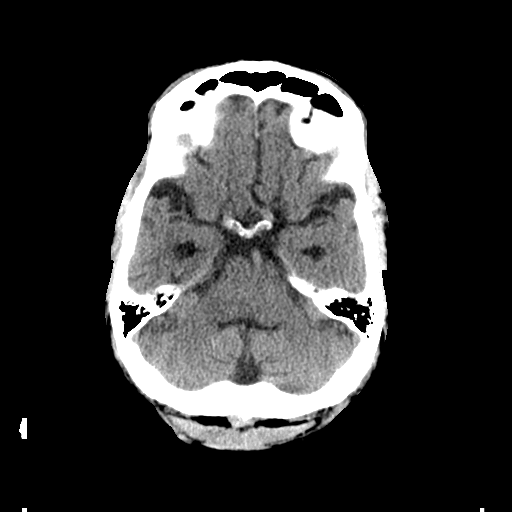
[im 9/33  bone]
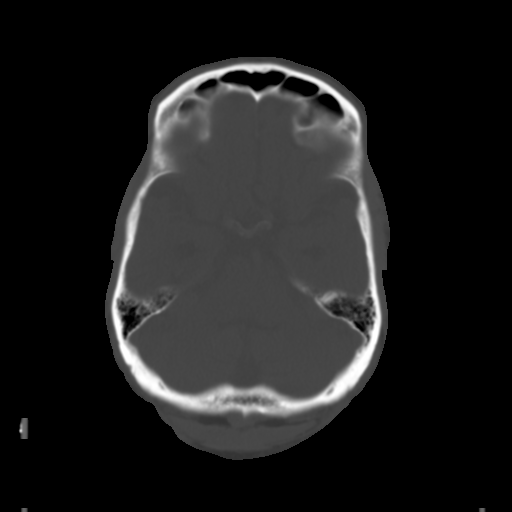
[im 12/33  brain]
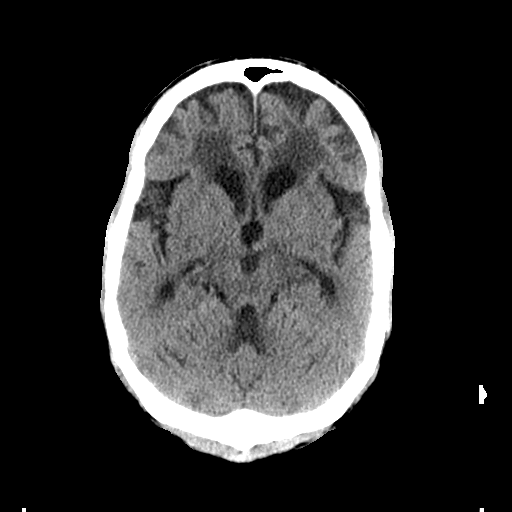
[im 14/33  brain]
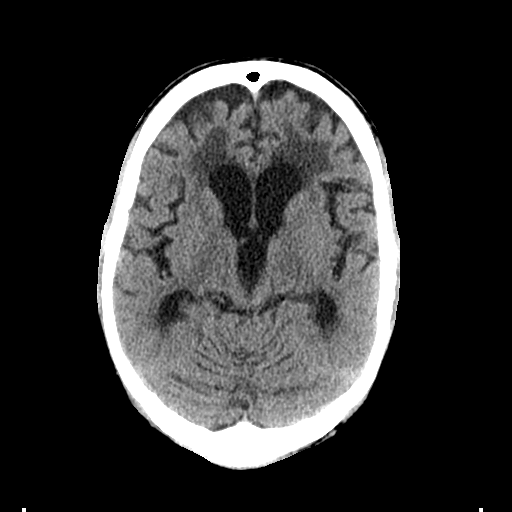
[im 16/33  brain]
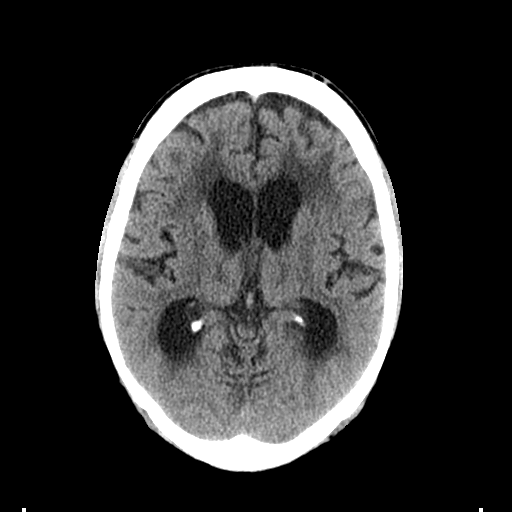
[im 17/33  brain]
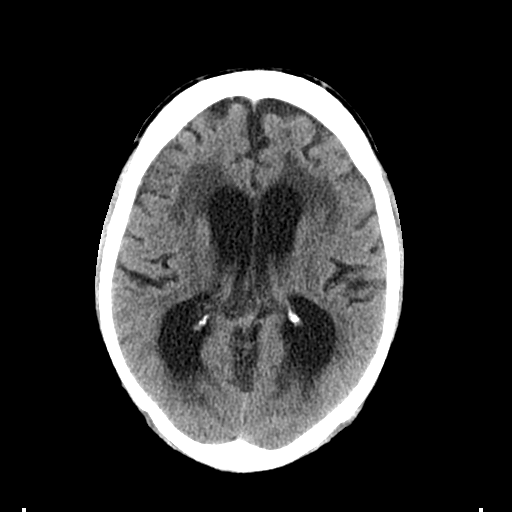
[im 17/33  bone]
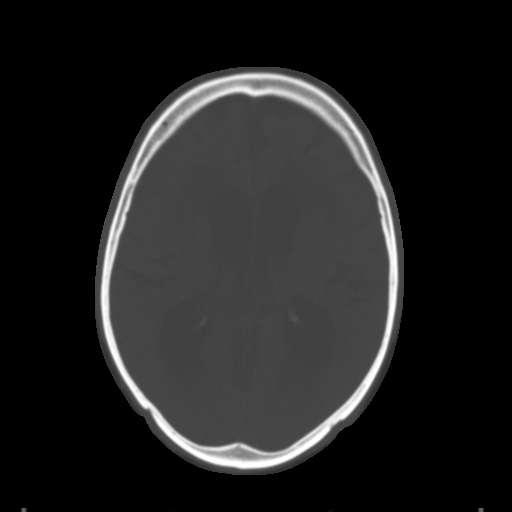
[im 19/33  brain]
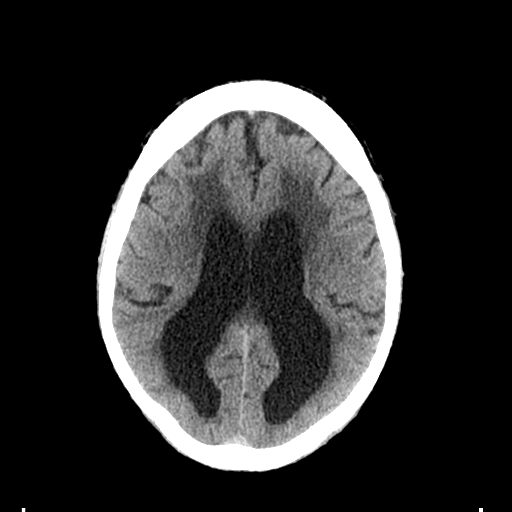
[im 21/33  brain]
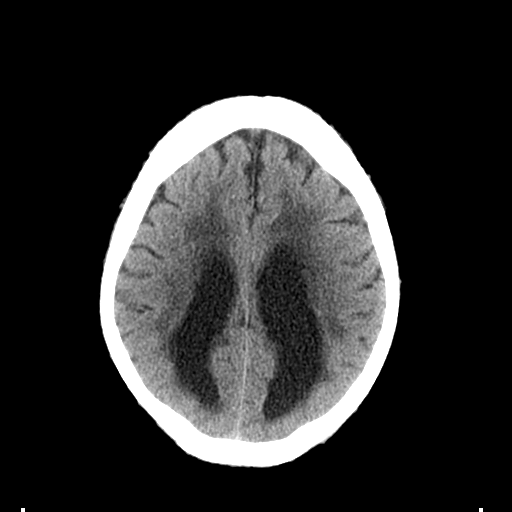
[im 24/33  brain]
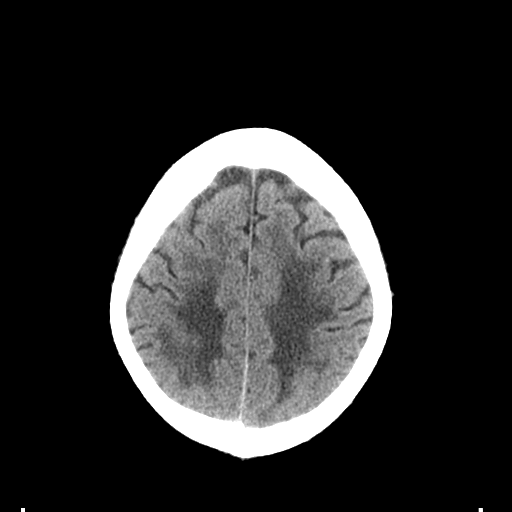
[im 25/33  brain]
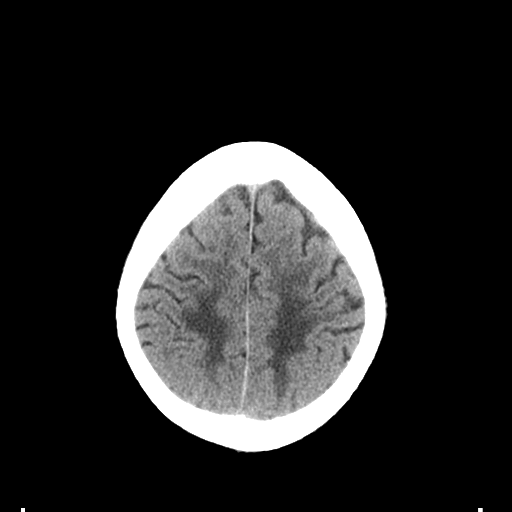
[im 25/33  bone]
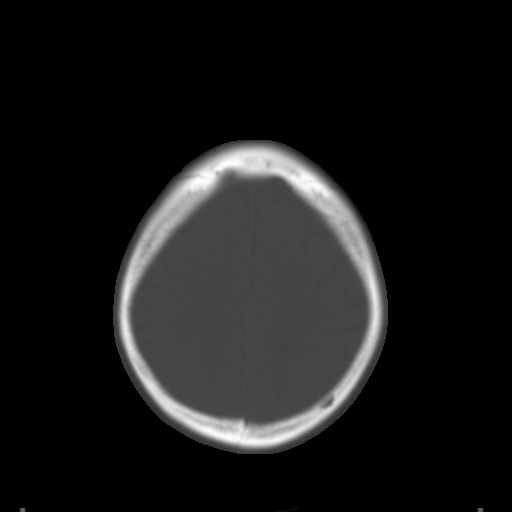
[im 27/33  brain]
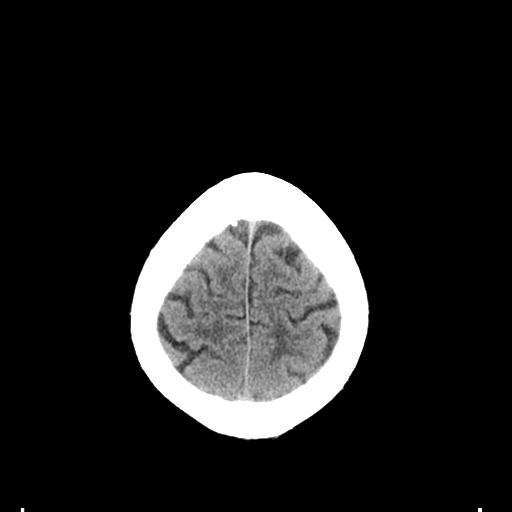
[im 29/33  brain]
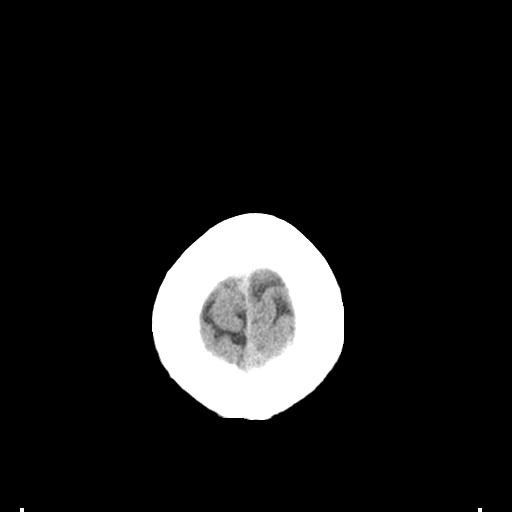
[im 31/33  brain]
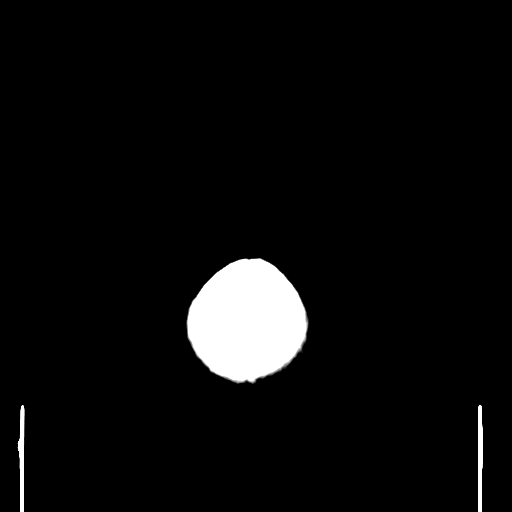

[16 of 30 positions shown; findings below may reference images not displayed]

FINDINGS: Brain: There is atrophy and chronic small vessel disease changes.
Associated ventriculomegaly, stable. No acute intracranial
abnormality. Specifically, no hemorrhage, hydrocephalus, mass
lesion, acute infarction, or significant intracranial injury.

Vascular: No hyperdense vessel or unexpected calcification.

Skull: No acute calvarial abnormality.

Sinuses/Orbits: No acute findings

Other: None
IMPRESSION: Atrophy, chronic microvascular disease.

No acute intracranial abnormality.

## 2021-08-10 ENCOUNTER — Other Ambulatory Visit: Payer: Self-pay

## 2021-08-10 ENCOUNTER — Emergency Department
Admission: EM | Admit: 2021-08-10 | Discharge: 2021-08-10 | Disposition: A | Discharge status: Home / Self Care | Payer: Medicare Other | Attending: Emergency Medicine | Admitting: Emergency Medicine

## 2021-08-10 ENCOUNTER — Emergency Department: Payer: Medicare Other

## 2021-08-10 DIAGNOSIS — R791 Abnormal coagulation profile: Secondary | ICD-10-CM | POA: Insufficient documentation

## 2021-08-10 DIAGNOSIS — Z20822 Contact with and (suspected) exposure to covid-19: Secondary | ICD-10-CM | POA: Diagnosis not present

## 2021-08-10 DIAGNOSIS — Z7982 Long term (current) use of aspirin: Secondary | ICD-10-CM | POA: Diagnosis not present

## 2021-08-10 DIAGNOSIS — E538 Deficiency of other specified B group vitamins: Secondary | ICD-10-CM | POA: Diagnosis not present

## 2021-08-10 DIAGNOSIS — F0280 Dementia in other diseases classified elsewhere without behavioral disturbance: Secondary | ICD-10-CM | POA: Diagnosis not present

## 2021-08-10 DIAGNOSIS — I4510 Unspecified right bundle-branch block: Secondary | ICD-10-CM | POA: Diagnosis not present

## 2021-08-10 DIAGNOSIS — I444 Left anterior fascicular block: Secondary | ICD-10-CM | POA: Insufficient documentation

## 2021-08-10 DIAGNOSIS — G309 Alzheimer's disease, unspecified: Secondary | ICD-10-CM | POA: Insufficient documentation

## 2021-08-10 DIAGNOSIS — G928 Other toxic encephalopathy: Secondary | ICD-10-CM | POA: Diagnosis not present

## 2021-08-10 DIAGNOSIS — R4182 Altered mental status, unspecified: Secondary | ICD-10-CM | POA: Insufficient documentation

## 2021-08-10 DIAGNOSIS — R262 Difficulty in walking, not elsewhere classified: Secondary | ICD-10-CM

## 2021-08-10 DIAGNOSIS — R531 Weakness: Secondary | ICD-10-CM | POA: Insufficient documentation

## 2021-08-10 DIAGNOSIS — R2990 Unspecified symptoms and signs involving the nervous system: Secondary | ICD-10-CM

## 2021-08-10 DIAGNOSIS — J9811 Atelectasis: Secondary | ICD-10-CM | POA: Insufficient documentation

## 2021-08-10 DIAGNOSIS — Z87891 Personal history of nicotine dependence: Secondary | ICD-10-CM | POA: Insufficient documentation

## 2021-08-10 LAB — DIFFERENTIAL
Abs Immature Granulocytes: 0.01 10*3/uL (ref 0.00–0.07)
Basophils Absolute: 0.1 10*3/uL (ref 0.0–0.1)
Basophils Relative: 1 %
Eosinophils Absolute: 0.1 10*3/uL (ref 0.0–0.5)
Eosinophils Relative: 3 %
Immature Granulocytes: 0 %
Lymphocytes Relative: 12 %
Lymphs Abs: 0.6 10*3/uL — ABNORMAL LOW (ref 0.7–4.0)
Monocytes Absolute: 0.6 10*3/uL (ref 0.1–1.0)
Monocytes Relative: 11 %
Neutro Abs: 3.9 10*3/uL (ref 1.7–7.7)
Neutrophils Relative %: 73 %

## 2021-08-10 LAB — PROTIME-INR
INR: 1.1 (ref 0.8–1.2)
Prothrombin Time: 14 seconds (ref 11.4–15.2)

## 2021-08-10 LAB — RESP PANEL BY RT-PCR (FLU A&B, COVID) ARPGX2
Influenza A by PCR: NEGATIVE
Influenza B by PCR: NEGATIVE
SARS Coronavirus 2 by RT PCR: NEGATIVE

## 2021-08-10 LAB — CBC
HCT: 39.1 % (ref 39.0–52.0)
Hemoglobin: 13.6 g/dL (ref 13.0–17.0)
MCH: 29.2 pg (ref 26.0–34.0)
MCHC: 34.8 g/dL (ref 30.0–36.0)
MCV: 84.1 fL (ref 80.0–100.0)
Platelets: 195 10*3/uL (ref 150–400)
RBC: 4.65 MIL/uL (ref 4.22–5.81)
RDW: 13.4 % (ref 11.5–15.5)
WBC: 5.3 10*3/uL (ref 4.0–10.5)
nRBC: 0 % (ref 0.0–0.2)

## 2021-08-10 LAB — COMPREHENSIVE METABOLIC PANEL
ALT: 17 U/L (ref 0–44)
AST: 20 U/L (ref 15–41)
Albumin: 3.8 g/dL (ref 3.5–5.0)
Alkaline Phosphatase: 62 U/L (ref 38–126)
Anion gap: 6 (ref 5–15)
BUN: 16 mg/dL (ref 8–23)
CO2: 24 mmol/L (ref 22–32)
Calcium: 8.5 mg/dL — ABNORMAL LOW (ref 8.9–10.3)
Chloride: 107 mmol/L (ref 98–111)
Creatinine, Ser: 1.86 mg/dL — ABNORMAL HIGH (ref 0.61–1.24)
GFR, Estimated: 36 mL/min — ABNORMAL LOW (ref >60)
Glucose, Bld: 107 mg/dL — ABNORMAL HIGH (ref 70–99)
Potassium: 4.2 mmol/L (ref 3.5–5.1)
Sodium: 137 mmol/L (ref 135–145)
Total Bilirubin: 1.3 mg/dL — ABNORMAL HIGH (ref 0.3–1.2)
Total Protein: 7.7 g/dL (ref 6.5–8.1)

## 2021-08-10 LAB — CBG MONITORING, ED: Glucose-Capillary: 114 mg/dL — ABNORMAL HIGH (ref 70–99)

## 2021-08-10 LAB — URINALYSIS, COMPLETE (UACMP) WITH MICROSCOPIC
Bilirubin Urine: NEGATIVE
Glucose, UA: NEGATIVE mg/dL
Hgb urine dipstick: NEGATIVE
Ketones, ur: NEGATIVE mg/dL
Leukocytes,Ua: NEGATIVE
Nitrite: NEGATIVE
Protein, ur: 100 mg/dL — AB
Specific Gravity, Urine: 1.013 (ref 1.005–1.030)
pH: 5 (ref 5.0–8.0)

## 2021-08-10 LAB — TROPONIN I (HIGH SENSITIVITY): Troponin I (High Sensitivity): 11 ng/L (ref <18)

## 2021-08-10 LAB — APTT: aPTT: 29 seconds (ref 24–36)

## 2021-08-10 IMAGING — CR DG CHEST 2V
2 series · 2 of 2 positions shown · non-contrast
Comparison: [DATE]

CLINICAL DATA: Altered mental status

EXAM:
CHEST - 2 VIEW

[chest lat]
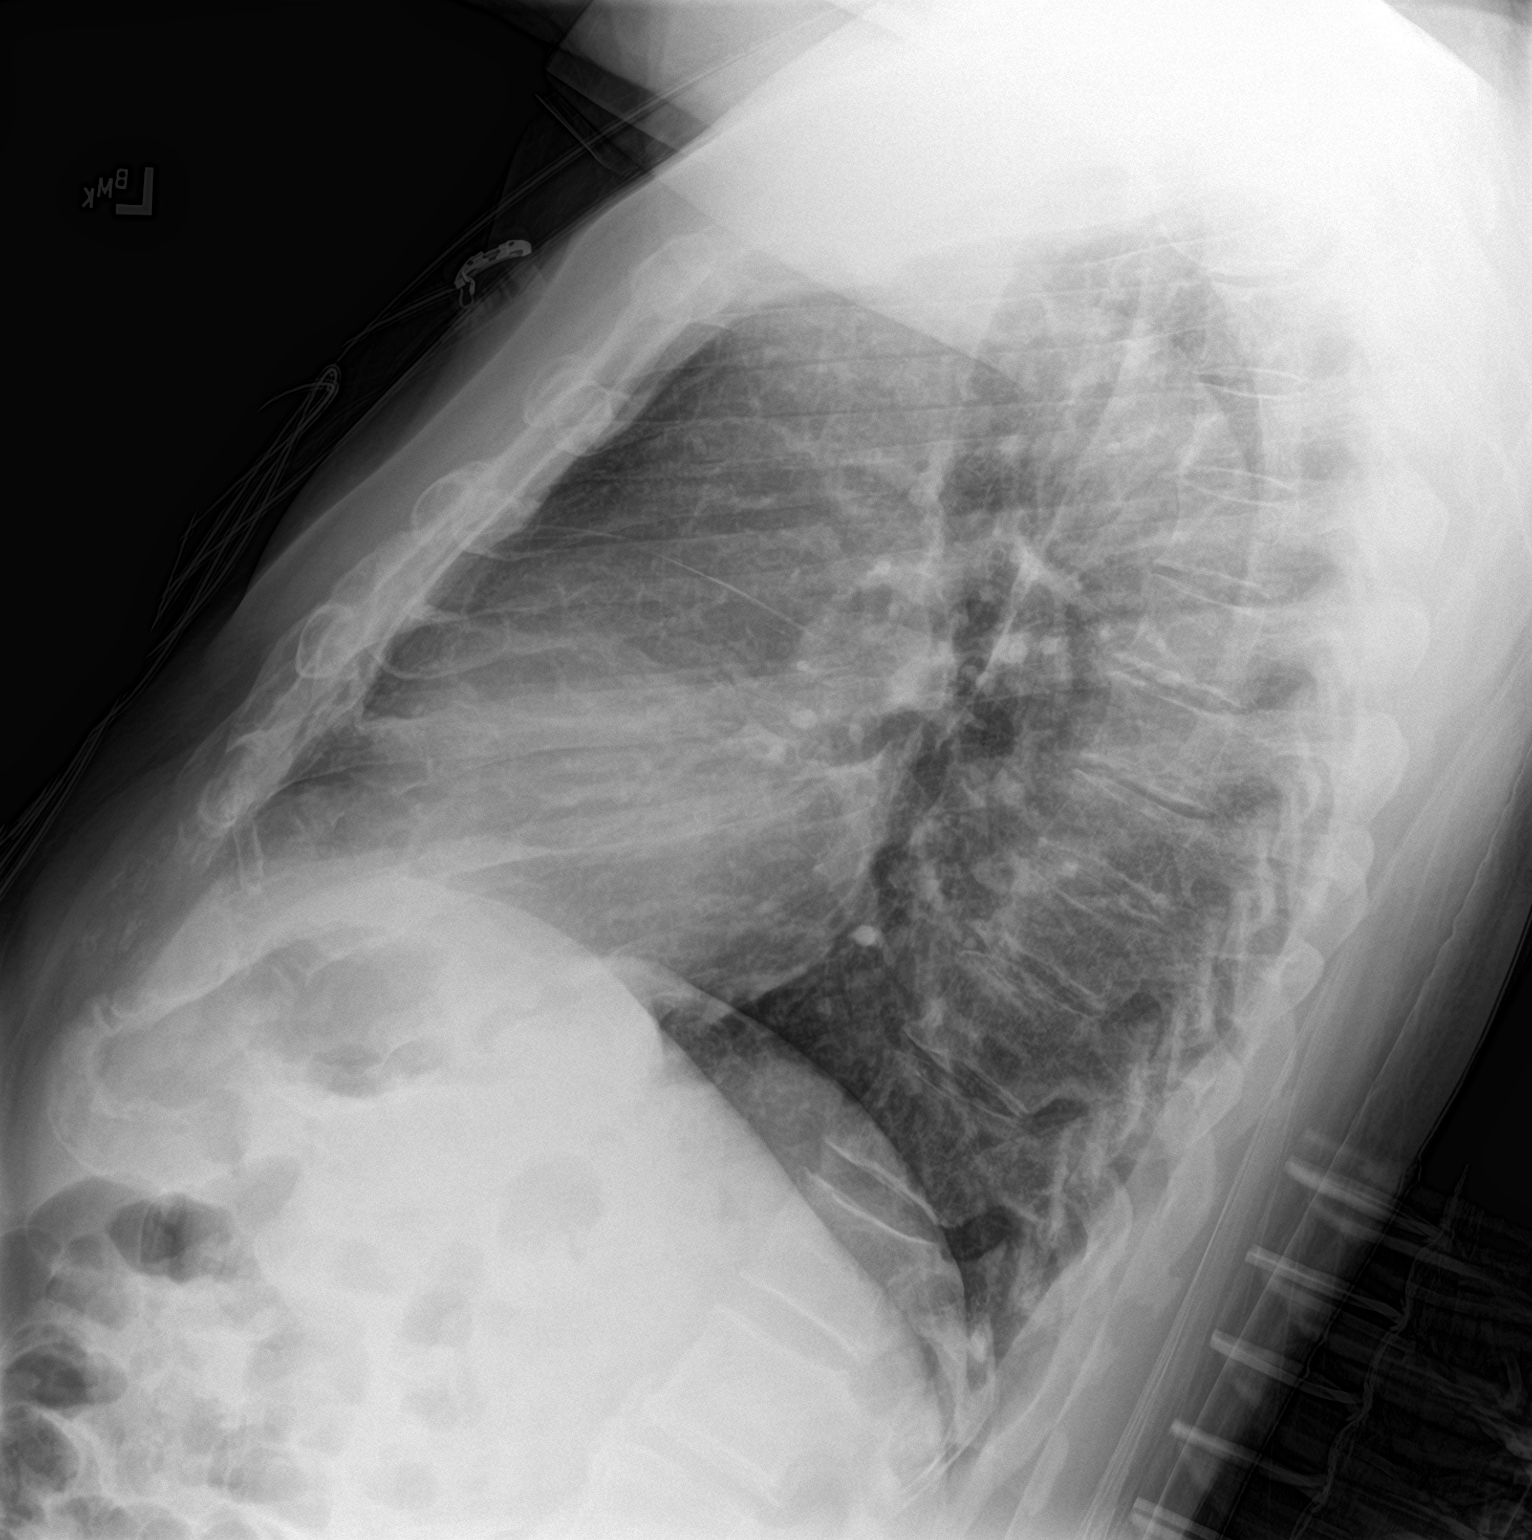

[chest ap]
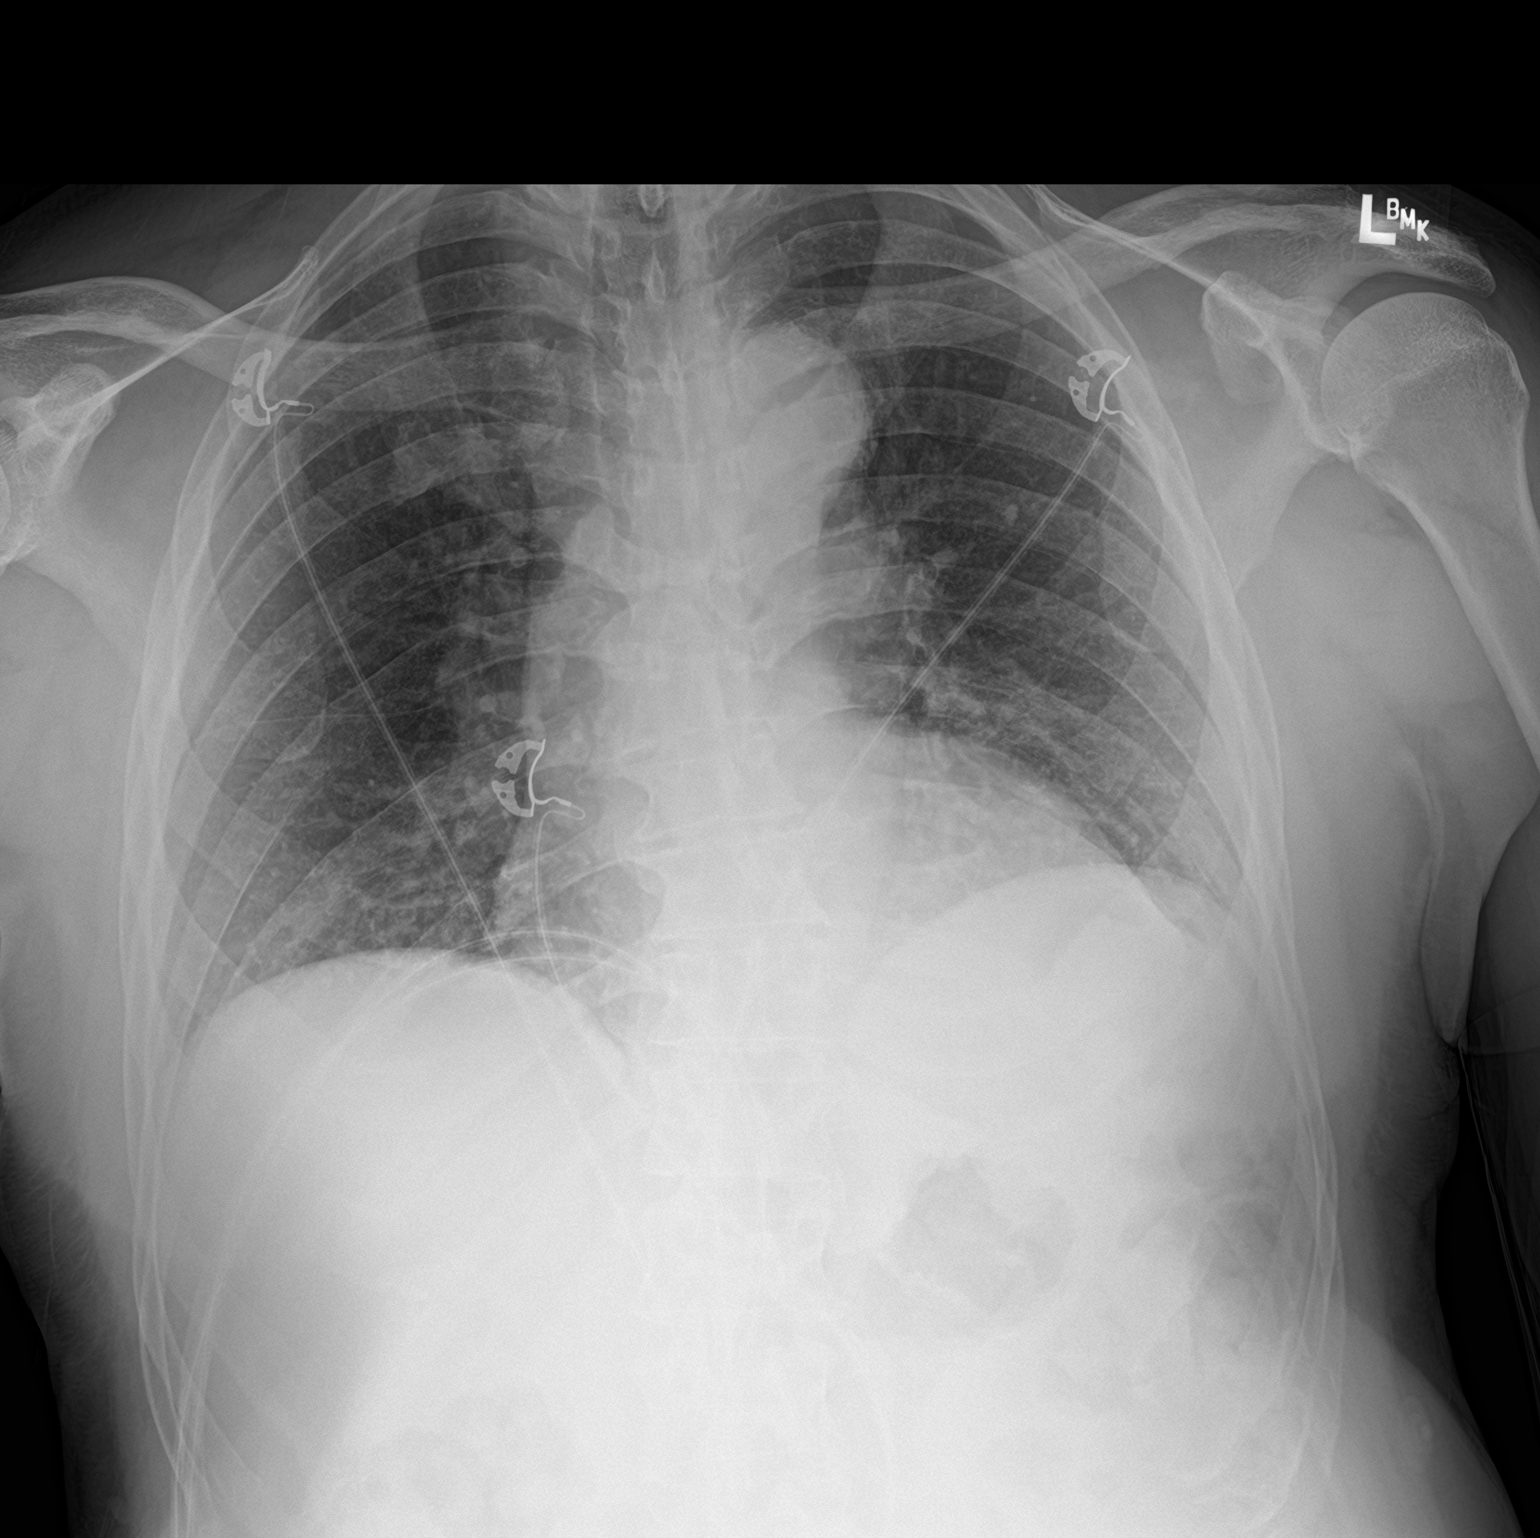

[2 of 2 positions shown; findings below may reference images not displayed]

FINDINGS: Cardiac shadow is stable. Tortuous thoracic aorta is noted. Lungs
are well aerated bilaterally. Minimal basilar atelectasis is noted
bilaterally. No bony abnormality is seen.
IMPRESSION: Mild bibasilar atelectasis.

## 2021-08-10 IMAGING — CT CT HEAD CODE STROKE
3 series · 15 of 47 positions shown, 18 images · non-contrast
Comparison: [DATE]

CLINICAL DATA: Code stroke.  Altered mental status

EXAM:
CT HEAD WITHOUT CONTRAST
TECHNIQUE: Contiguous axial images were obtained from the base of the skull
through the vertex without intravenous contrast.

[Series 4: head wo · axial · 0.46mm/px · z∈[+568,+703]mm · 9 of 33 slices shown, 12 images]
[im 3/33  brain]
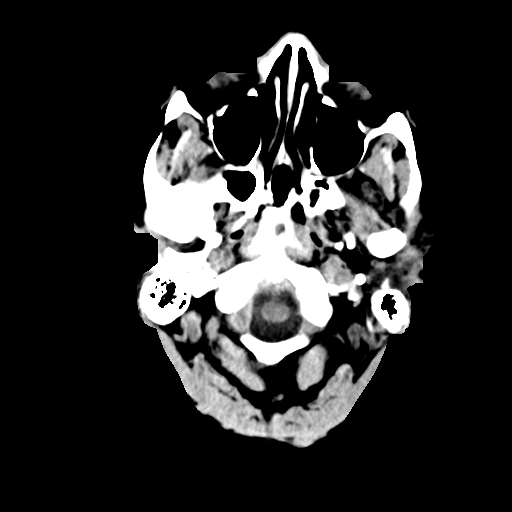
[im 3/33  bone]
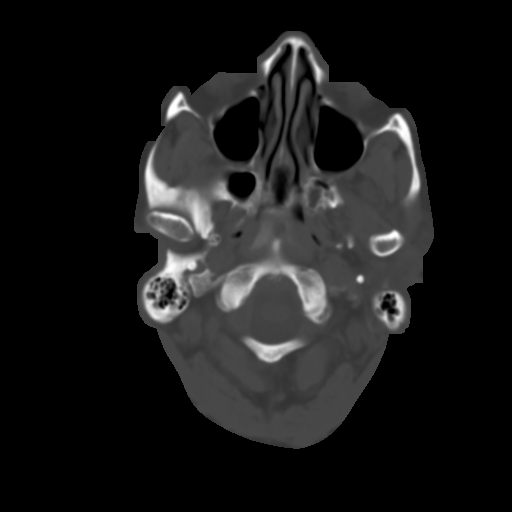
[im 6/33  brain]
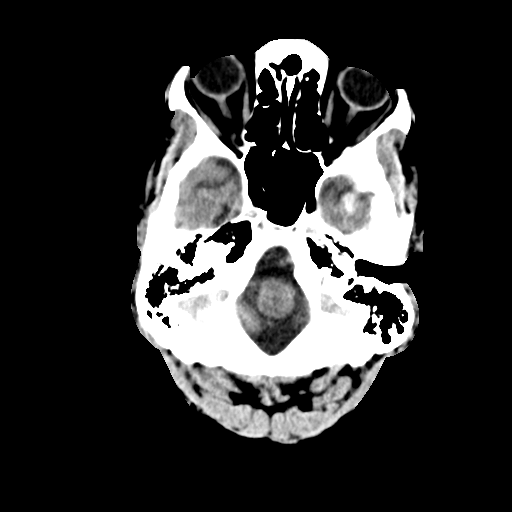
[im 9/33  brain]
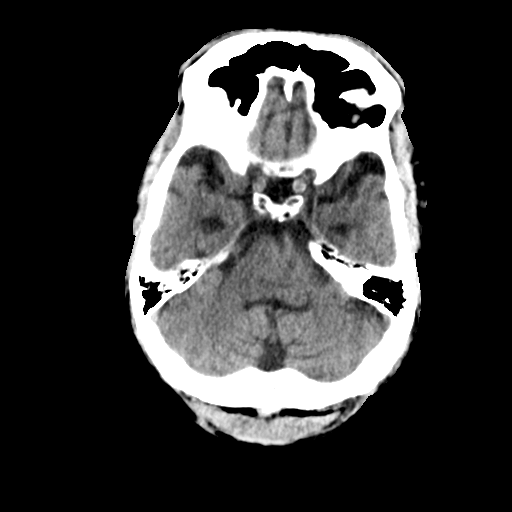
[im 13/33  brain]
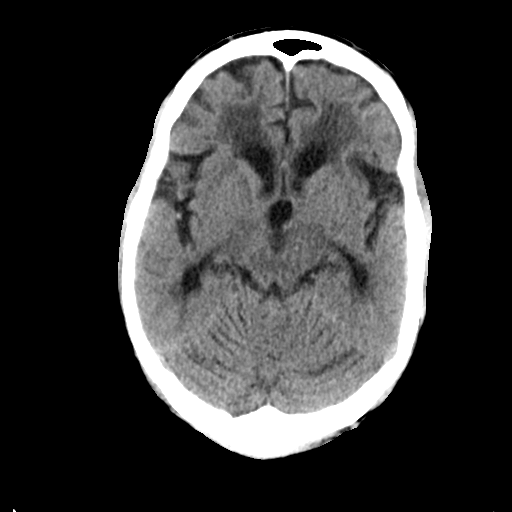
[im 17/33  brain]
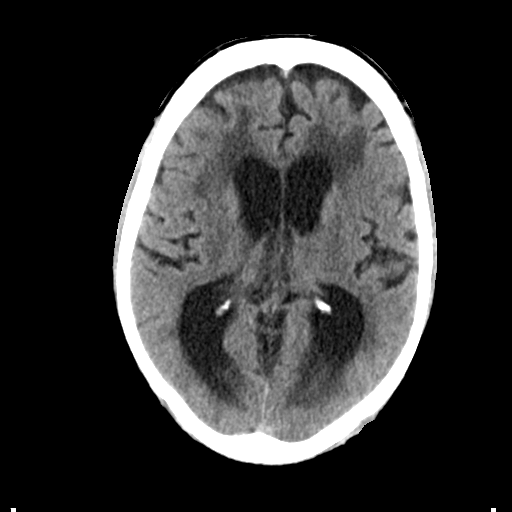
[im 17/33  bone]
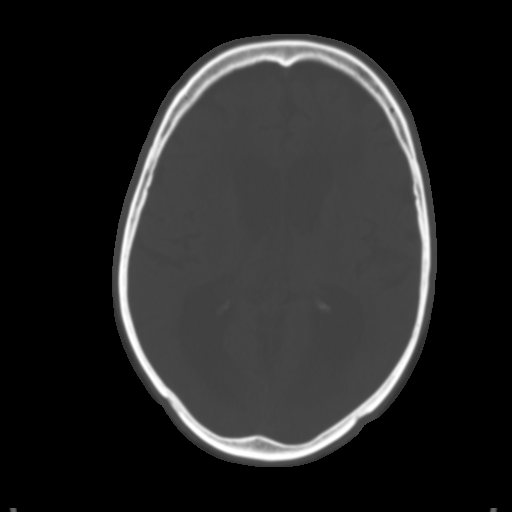
[im 20/33  brain]
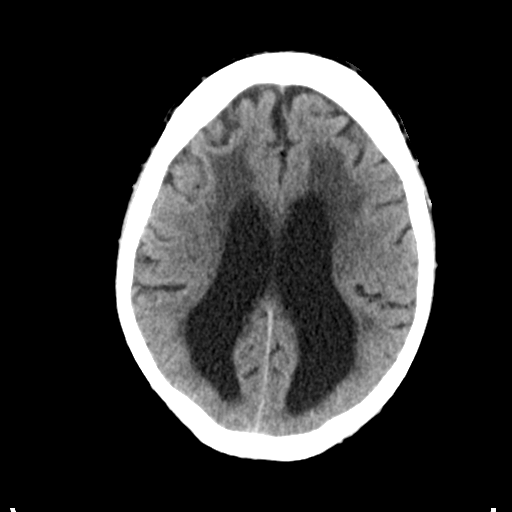
[im 24/33  brain]
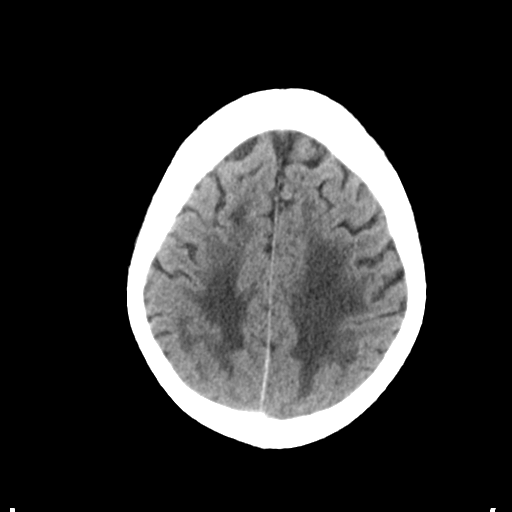
[im 27/33  brain]
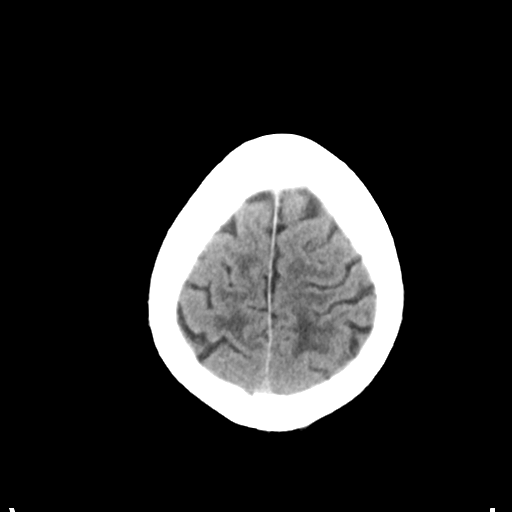
[im 30/33  brain]
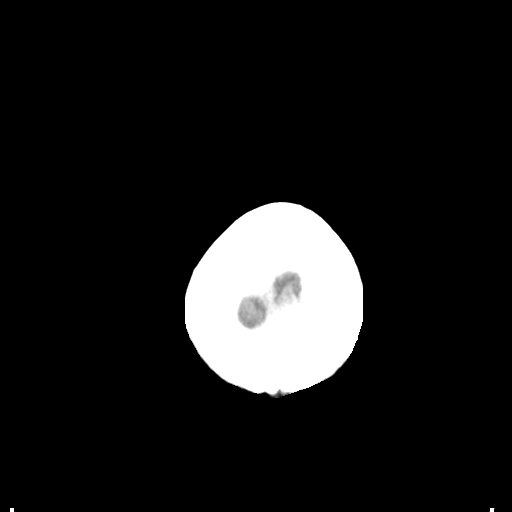
[im 30/33  bone]
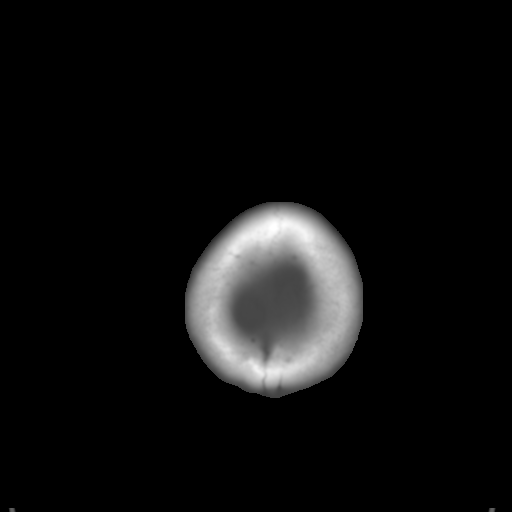

[Series 5: coronal soft tissue · coronal · 0.34mm/px · 3 of 71 slices shown]
[im 24/71  brain]
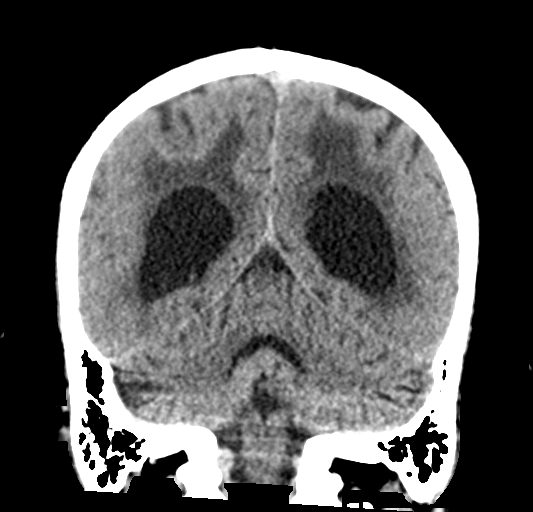
[im 32/71  brain]
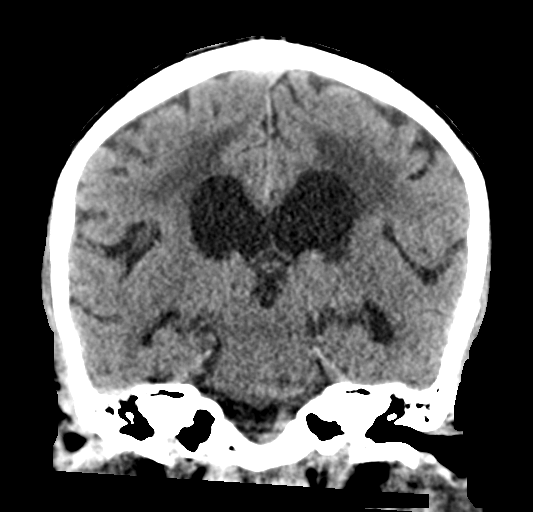
[im 39/71  brain]
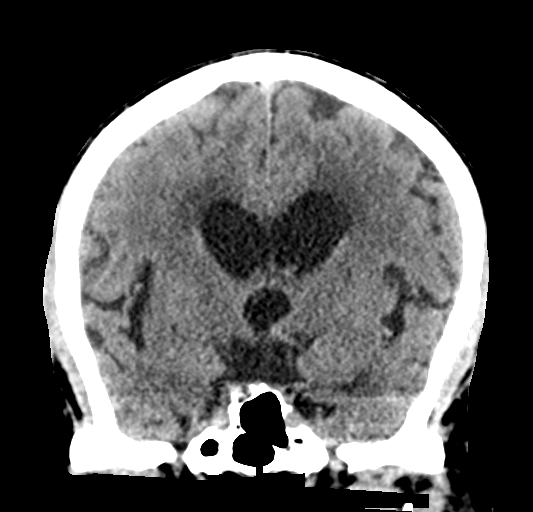

[Series 6: sagittal soft tissue · sagittal · 0.34mm/px · 3 of 60 slices shown]
[im 20/60  brain]
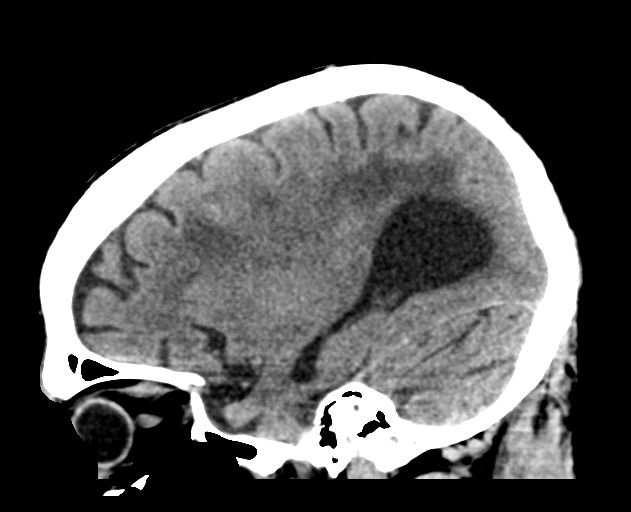
[im 30/60  brain]
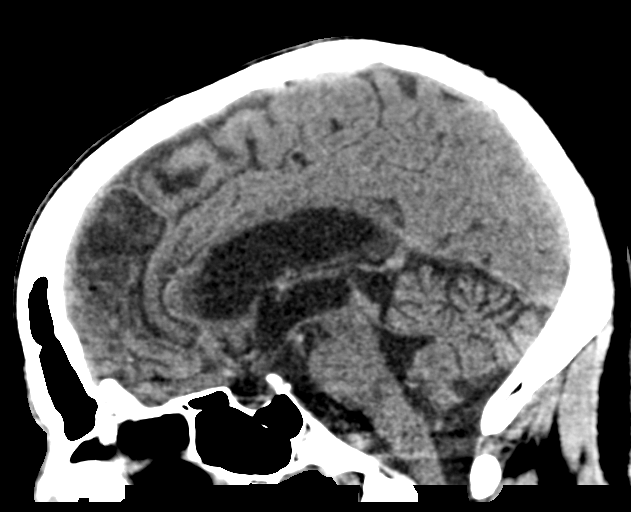
[im 40/60  brain]
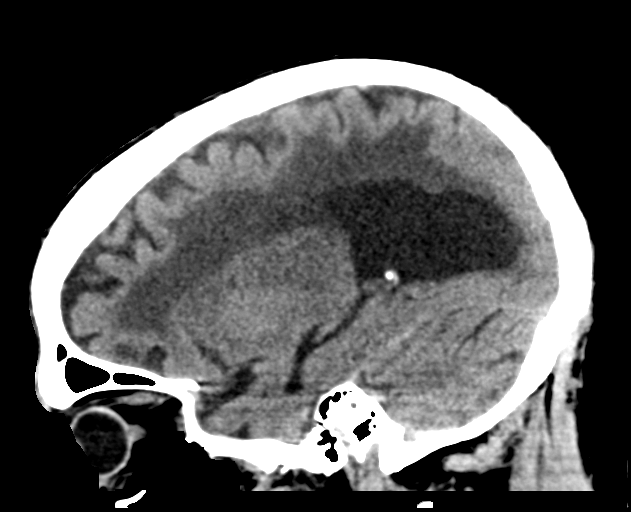

[15 of 47 positions shown; findings below may reference images not displayed]

FINDINGS: Brain: Redemonstrated cerebral atrophy and associated ex vacuo
dilatation of the ventricles, unchanged. No evidence of acute
infarction, hemorrhage, cerebral edema, mass, mass effect, or
midline shift. Confluent periventricular white matter changes,
likely the sequela of severe chronic small vessel ischemic disease.
No extra-axial fluid collection.

Vascular: No hyperdense vessel. Calcifications in the intracranial
carotid arteries.

Skull: Normal. Negative for fracture or focal lesion.

Sinuses/Orbits: No acute finding. Status post right lens
replacement.

Other: The mastoid air cells are well aerated.

ASPECTS ([place] Stroke Program Early CT Score)

- Ganglionic level infarction (caudate, lentiform nuclei, internal
capsule, insula, M1-M3 cortex): 7

- Supraganglionic infarction (M4-M6 cortex): 3

Total score (0-10 with 10 being normal): 10
IMPRESSION: 1. No acute intracranial process.
2. ASPECTS is 10

Code stroke imaging results were communicated on [DATE] at [DATE] to provider Dr. BRISSA via telephone, who verbally
acknowledged these results.

## 2021-08-10 MED ORDER — SODIUM CHLORIDE 0.9% FLUSH: 3.0000 mL | Freq: Once | INTRAVENOUS | Status: DC

## 2021-08-10 NOTE — ED Provider Notes (Signed)
[facility] Emergency Department Provider Note  ____________________________________________   Event Date/Time   First MD Initiated Contact with Patient 08/10/21 1741     (approximate)  I have reviewed the triage vital signs and the nursing notes.   HISTORY  Chief Complaint Altered Mental Status   HPI Robert Zamora is a 80 y.o. male past medical history of depression, anxiety, and dementia who presents accompanied by his wife for evaluation of sudden change in mental status that reportedly occurred around 4:30 PM today.  Per wife patient completely "froze".  He did not have any clear focal weakness but was unable to walk or speak properly.  She thinks this lasted around 30 minutes.  She thinks he is now close to back to baseline.  No prior similar episodes.  No associated falls or injuries or complete loss of consciousness.  No generalized shaking.  He states he has some baseline difficulty remembering things from his dementia.  Patient denies any pain including headache, chest pain, abdominal pain, vomiting, diarrhea, dysuria, cough or any other clear sick symptoms today.         Past Medical History:  Diagnosis Date   Alzheimer disease ([place])     There are no problems to display for this patient.   Past Surgical History:  Procedure Laterality Date   CIRCUMCISION      Prior to Admission medications   Medication Sig Start Date End Date Taking? Authorizing Provider  aspirin 81 MG EC tablet Take by mouth.    [provider]  buPROPion (WELLBUTRIN XL) 150 MG 24 hr tablet Take 1 tablet by mouth every morning. 03/30/21   [provider]  donepezil (ARICEPT) 10 MG tablet Take 1 tablet by mouth daily. 03/30/21   [provider]  memantine (NAMENDA) 10 MG tablet Take by mouth. 11/29/20   [provider]  olmesartan (BENICAR) 40 MG tablet Take by mouth. 12/24/20   [provider]  ondansetron (ZOFRAN ODT) 4 MG  disintegrating tablet Take 1 tablet (4 mg total) by mouth every 8 (eight) hours as needed for nausea or vomiting. 02/19/21   Carrie Mew, MD  tamsulosin (FLOMAX) 0.4 MG CAPS capsule Take 0.4 mg by mouth.    [provider]    Allergies Patient has no known allergies.  No family history on file.  Social History Social History   Tobacco Use   Smoking status: Former   Smokeless tobacco: Never  Substance Use Topics   Alcohol use: Not Currently    Review of Systems  Review of Systems  Unable to perform ROS: Mental status change     ____________________________________________   PHYSICAL EXAM:  VITAL SIGNS: ED Triage Vitals [08/10/21 1729]  Enc Vitals Group     BP 130/72     Pulse Rate 78     Resp 16     Temp 98.5 F (36.9 C)     Temp src      SpO2 98 %     Weight      Height      Head Circumference      Peak Flow      Pain Score      Pain Loc      Pain Edu?      Excl. in [place]?    Vitals:   08/10/21 1729 08/10/21 1800  BP: 130/72 126/82  Pulse: 78 75  Resp: 16 (!) 23  Temp: 98.5 F (36.9 C)   SpO2: 98% 95%  Physical Exam Vitals and nursing note reviewed.  Constitutional:      Appearance: He is well-developed.  HENT:     Head: Normocephalic and atraumatic.     Right Ear: External ear normal.     Left Ear: External ear normal.     Nose: Nose normal.  Eyes:     Conjunctiva/sclera: Conjunctivae normal.  Cardiovascular:     Rate and Rhythm: Normal rate and regular rhythm.     Heart sounds: No murmur heard. Pulmonary:     Effort: Pulmonary effort is normal. No respiratory distress.     Breath sounds: Normal breath sounds.  Abdominal:     Palpations: Abdomen is soft.     Tenderness: There is no abdominal tenderness.  Musculoskeletal:     Cervical back: Neck supple.  Skin:    General: Skin is warm and dry.  Neurological:     Mental Status: He is alert. He is confused.  Psychiatric:        Mood and Affect: Mood normal.      ____________________________________________   LABS (all labs ordered are listed, but only abnormal results are displayed)  Labs Reviewed  DIFFERENTIAL - Abnormal; Notable for the following components:      Result Value   Lymphs Abs 0.6 (*)    All other components within normal limits  COMPREHENSIVE METABOLIC PANEL - Abnormal; Notable for the following components:   Glucose, Bld 107 (*)    Creatinine, Ser 1.86 (*)    Calcium 8.5 (*)    Total Bilirubin 1.3 (*)    GFR, Estimated 36 (*)    All other components within normal limits  URINALYSIS, COMPLETE (UACMP) WITH MICROSCOPIC - Abnormal; Notable for the following components:   Color, Urine YELLOW (*)    APPearance HAZY (*)    Protein, ur 100 (*)    Bacteria, UA RARE (*)    All other components within normal limits  CBG MONITORING, ED - Abnormal; Notable for the following components:   Glucose-Capillary 114 (*)    All other components within normal limits  RESP PANEL BY RT-PCR (FLU A&B, COVID) [id number]  PROTIME-INR  APTT  CBC  I-STAT CREATININE, ED  CBG MONITORING, ED  TROPONIN I (HIGH SENSITIVITY)  TROPONIN I (HIGH SENSITIVITY)   ____________________________________________  EKG  Sinus rhythm with a ventricular rate of 75, right bundle branch block, left anterior fascicle block, evidence of left ventricular hypertrophy with nonspecific ST changes in inferior and lateral leads versus some artifact without other clear evidence of acute ischemia ____________________________________________  RADIOLOGY  ED MD interpretation: CT head without contrast shows no evidence of intracranial hemorrhage, subacute CVA, mass-effect or other clear acute intracranial process.  Chest x-ray shows no focal consolidation, effusion, edema, pneumothorax or other clear acute process.  There is some mild atelectasis.  Official radiology report(s): DG Chest 2 View  Result Date: 08/10/2021 CLINICAL DATA:  Altered mental status EXAM: CHEST - 2  VIEW COMPARISON:  02/19/2021 FINDINGS: Cardiac shadow is stable. Tortuous thoracic aorta is noted. Lungs are well aerated bilaterally. Minimal basilar atelectasis is noted bilaterally. No bony abnormality is seen. IMPRESSION: Mild bibasilar atelectasis. Electronically Signed   By: Inez Catalina M.D.   On: 08/10/2021 19:14   CT HEAD CODE STROKE WO CONTRAST  Result Date: 08/10/2021 CLINICAL DATA:  Code stroke.  Altered mental status EXAM: CT HEAD WITHOUT CONTRAST TECHNIQUE: Contiguous axial images were obtained from the base of the skull through the vertex without intravenous contrast. COMPARISON:  08/05/2021  FINDINGS: Brain: Redemonstrated cerebral atrophy and associated ex vacuo dilatation of the ventricles, unchanged. No evidence of acute infarction, hemorrhage, cerebral edema, mass, mass effect, or midline shift. Confluent periventricular white matter changes, likely the sequela of severe chronic small vessel ischemic disease. No extra-axial fluid collection. Vascular: No hyperdense vessel. Calcifications in the intracranial carotid arteries. Skull: Normal. Negative for fracture or focal lesion. Sinuses/Orbits: No acute finding. Status post right lens replacement. Other: The mastoid air cells are well aerated. ASPECTS [facility] Stroke Program Early CT Score) - Ganglionic level infarction (caudate, lentiform nuclei, internal capsule, insula, M1-M3 cortex): 7 - Supraganglionic infarction (M4-M6 cortex): 3 Total score (0-10 with 10 being normal): 10 IMPRESSION: 1. No acute intracranial process. 2. ASPECTS is 10 Code stroke imaging results were communicated on 08/10/2021 at 6:05 pm to provider Dr. Hulan Saas via telephone, who verbally acknowledged these results. Electronically Signed   By: Merilyn Baba M.D.   On: 08/10/2021 18:05    ____________________________________________   PROCEDURES  Procedure(s) performed (including Critical  Care):  Procedures   ____________________________________________   INITIAL IMPRESSION / ASSESSMENT AND PLAN / ED COURSE      Presents emergency room for evaluation of sudden onset of weakness and altered mental status as described above.  On arrival he is afebrile and hemodynamically stable.  Given reported history in triage patient was made code stroke from triage.  Additional differential includes possible seizure, metabolic derangements, atypical migraine, NPH, possible freezing episode from progressive dementia.  CT head without contrast shows no evidence of intracranial hemorrhage, subacute CVA, mass-effect or other clear acute intracranial process.  Chest x-ray shows no focal consolidation, effusion, edema, pneumothorax or other clear acute process.  There is some mild atelectasis.  ECG and nonelevated troponin are not suggestive of atypical presentation for ACS or arrhythmia.  CMP shows no significant electrolyte or metabolic derangements.  UA has no evidence of infection.  CBC shows no evidence of acute anemia or leukocytosis.  On several reassessments patient has nonfocal exam and per wife is back to baseline.  After discussion with consulting neurologist given otherwise reassuring exam and work-up with nonfocal exam and several reassessments no need at this time for further work-up inpatient and patient to follow-up with his PCP.  Patient's wife and patient are comfortable with this.  Given his resolved with breathing and per wife is now back to baseline before this episode I think this is reasonable.  Discharged stable condition.  Strict precautions advised and discussed.     ____________________________________________   FINAL CLINICAL IMPRESSION(S) / ED DIAGNOSES  Final diagnoses:  Altered mental status, unspecified altered mental status type    Medications  sodium chloride flush (NS) 0.9 % injection 3 mL (has no administration in time range)     ED Discharge  Orders     None        Note:  This document was prepared using Dragon voice recognition software and may include unintentional dictation errors.    Lucrezia Starch, MD 08/10/21 1949

## 2021-08-10 NOTE — Consult Note (Signed)
[facility] Telemedicine Consult   Requesting Provider: Dr. Tamala Julian, [place] Consult Participants: Dr. Jerelyn Charles, Telespecialist RN Leana Roe   Bedside RN Rush [facility]mark Location of the provider: [place], [place]  Location of the patient: Emergency department bed 1 at [facility], [facility]  This consult was provided via telemedicine with 2-way video and audio communication. The patient/family was informed that care would be provided in this way and agreed to receive care in this manner.    Chief Complaint: Altered mental status  HPI: 80 year old man with past medical history of hypertension, dementia, gait difficulty with presumptive diagnosis of normal pressure hydrocephalus-has been through extensive work-up to rule out multiple neurodegenerative processes such as ALS and Parkinson's due to frequent falls and gait abnormality ongoing for many years, lives at home with wife, presenting to the emergency room for evaluation of altered mental status and difficulty walking along with not behaving like his normal self. The wife reports that his last known well was somewhere around 9 PM last night on 08/09/2021 when he went to bed.  He woke up this morning appearing a little bit more confused and slow.  He is generally very slow to respond and walk due to his presumed normal pressure hydrocephalus.  He has been evaluated by neurosurgery at [facility] did not do well after the lumbar puncture according to the wife and has follow-up appointments for further work-up.  This morning, she had to go outside for some errands but did not really want to leave them alone but ultimately had to go.  When she came back, she saw him standing by the recycle can froze and not responsive but standing with eyes open and not being able to move.  She got into the ER.  In the emergency room, initial screen concerning for left face and right sided weakness that prompted a code stroke activation due to last known normal at  that time being provided of some time closer to the 304-hour window but eventually wife confirmed that the last known well was sometime last night. At baseline, he is able to take care of himself some but the wife has not left him for extended periods of time for a long time due to his dementia as well as gait difficulty. He has a lot of accidents in terms of his bladder and that is why the diagnosis of NPH is being actively pursued. He has not been complaining of any pain during micturition but the wife says that he also does not complain of problems even when he has had UTIs or any other issues. Patient was not able to provide any meaningful history at this time although he was conversant and nonfocal. No reported seizure or history of seizures.   Past Medical History:  Diagnosis Date   Alzheimer disease ([place])      Current Facility-Administered Medications:    sodium chloride flush (NS) 0.9 % injection 3 mL, 3 mL, Intravenous, Once, Tamala Julian, Ida Rogue, MD  Current Outpatient Medications:    aspirin 81 MG EC tablet, Take by mouth., Disp: , Rfl:    buPROPion (WELLBUTRIN XL) 150 MG 24 hr tablet, Take 1 tablet by mouth every morning., Disp: , Rfl:    donepezil (ARICEPT) 10 MG tablet, Take 1 tablet by mouth daily., Disp: , Rfl:    memantine (NAMENDA) 10 MG tablet, Take by mouth., Disp: , Rfl:    olmesartan (BENICAR) 40 MG tablet, Take by mouth., Disp: , Rfl:    ondansetron (ZOFRAN ODT) 4  MG disintegrating tablet, Take 1 tablet (4 mg total) by mouth every 8 (eight) hours as needed for nausea or vomiting., Disp: 20 tablet, Rfl: 0   tamsulosin (FLOMAX) 0.4 MG CAPS capsule, Take 0.4 mg by mouth., Disp: , Rfl:     LKW: 9 PM last night tpa given?: No, outside the window IR Thrombectomy? No, nonfocal exam, poor modified Rankin Modified Rankin-4 Time of teleneurologist evaluation: 5:41 PM  Exam: Vitals:   08/10/21 1729  BP: 130/72  Pulse: 78  Resp: 16  Temp: 98.5 F (36.9 C)  SpO2: 98%     General: Awake alert in no distress Neurological exam Awake alert oriented to self, could not tell me the month, was able to tell his age correctly.  No dysarthria.  No aphasia-naming comprehension and repetition are intact.  Language is fluent.  No cranial nerve deficits.  No motor deficits.  No sensory deficits including extinction, and no dysmetria on finger-nose-finger testing.  NIH stroke scale 1 for missing the month.   NIHSS 1A: Level of Consciousness - 0 1B: Ask Month and Age - 1 1C: 'Blink Eyes' & 'Squeeze Hands' -0 2: Test Horizontal Extraocular Movements -0 3: Test Visual Fields -0 4: Test Facial Palsy -0 5A: Test Left Arm Motor Drift -0 5B: Test Right Arm Motor Drift -0 6A: Test Left Leg Motor Drift -0 6B: Test Right Leg Motor Drift -0 7: Test Limb Ataxia -0 8: Test Sensation -0 9: Test Language/Aphasia-0 10: Test Dysarthria -0 11: Test Extinction/Inattention -0 NIHSS score: 1   Imaging Reviewed: Noncontrast head CT done stat in the emergency room personally reviewed-no acute changes-significant ventricular enlargement with no change from prior CTH of 9/28. Advanced WM disease.  Did not particularly notice any hyperdense vessel  Labs reviewed in epic and pertinent values follow: Prior labs reviewed.  Today's labs pending. CBC    Component Value Date/Time   WBC 5.5 02/19/2021 1406   RBC 4.83 02/19/2021 1406   HGB 13.5 02/19/2021 1406   HCT 39.9 02/19/2021 1406   PLT 198 02/19/2021 1406   MCV 82.6 02/19/2021 1406   MCH 28.0 02/19/2021 1406   MCHC 33.8 02/19/2021 1406   RDW 13.8 02/19/2021 1406   CMP     Component Value Date/Time   NA 139 02/19/2021 1406   K 4.6 02/19/2021 1406   CL 109 02/19/2021 1406   CO2 22 02/19/2021 1406   GLUCOSE 102 (H) 02/19/2021 1406   BUN 15 02/19/2021 1406   CREATININE 1.73 (H) 02/19/2021 1406   CALCIUM 9.2 02/19/2021 1406   PROT 7.9 02/19/2021 1406   ALBUMIN 3.6 02/19/2021 1406   AST 19 02/19/2021 1406   ALT 16  02/19/2021 1406   ALKPHOS 58 02/19/2021 1406   BILITOT 1.3 (H) 02/19/2021 1406   GFRNONAA 39 (L) 02/19/2021 1406     Assessment: 80 year old man with significant past medical history of gait difficulty, urinary incontinence and dementia-concern for normal pressure hydrocephalus currently being investigated by neurosurgery and neurology at [facility], presenting for an episode of altered mental status, acting confused, standing by a trash bin not being able to move suggesting gait initiation failure, likely a part of his ongoing neurodegenerative process.  Examination was nonfocal for me in terms of concern for stroke. Last known well was sometime last night when he went to bed at 9 PM-outside the window for IV thrombolysis. Poor modified Rankin score and nonfocal exam also make him not a candidate for endovascular thrombectomy. I suspect that his current  presentation is related to his dementia and might have been exacerbated altered mental status secondary to underlying infection versus progression of his disease.   Recommendations:  -Urinalysis -Chest x-ray -CBC -CMP -If all remains unremarkable and he is not back to baseline, can admit to observation for EEG although no known history of seizures and no report of seizures. -An MRI of the brain can also be considered if he is not back to his baseline to ensure that a stroke was not missed but the likelihood of that is low. -If the patient is admitted, inpatient neurology will follow. Otherwise he should follow-up with his outpatient neurologist and neurosurgery teams.  I discussed my plan in detail with Dr. Tamala Julian at the patient's bedside over the camera. I also discussed my plan in detail with the patient's wife who was at the bedside and answered all her questions to the best my ability.  Telemedicine and critical care attestation This patient is receiving care for possible acute neurological changes. There was 31 minutes of critical care by  this provider at the time of service, including time for direct evaluation via telemedicine, review of medical records, imaging studies and discussion of findings with providers, the patient and/or family.  The patient was presumed to be critically sick and at significant risk for neurological worsening and/or death and care requires constant monitoring.  -- Amie [place], MD [facility] Pager: [phone]

## 2021-08-10 NOTE — ED Notes (Signed)
Initiated code stroke to [place], [place]

## 2021-08-10 NOTE — ED Notes (Signed)
Patient's wife reports that patient has not been his normal self today.  Went to bed last night at around 2100.

## 2021-08-10 NOTE — Progress Notes (Signed)
   08/10/21 1737  Clinical Encounter Type  Visited With Patient and family together  Visit Type Initial;Code  Referral From Nurse   This chaplain received a code stroke page to support the patient. Upon arrival, the patient was sitting up in the bed with his spouse at the bedside. He was awake, alert, and communicative. No needs expressed at this time, but they shared their gratitude for the offer of support.  Gennaro Africa, Chaplain

## 2021-08-10 NOTE — ED Notes (Signed)
Patient is dressed with all monitoring leads off, b/p cuff off at this time.

## 2021-08-10 NOTE — ED Triage Notes (Addendum)
Pt come with c/o AMS. Wife reports pt became confused and was unable to walk all of the sudden. Pt not at baseline per wife.  Wife reports this started at 430pm. Pt has drift and facial droop noted.   CODE STROKE called at this time. Attempted to call CT with no answer, pt taken to CT by EDT [facility]

## 2021-08-11 ENCOUNTER — Telehealth: Payer: Self-pay

## 2021-08-11 NOTE — Telephone Encounter (Signed)
Called to speak with patient and spoke with Braylee Bosher, wife, at [phone] on 08/11/2021 at 645pm, regarding scheduled procedure of lumbar puncture 08/14/2021 to arrive at registration 8:30am for 9:00am procedure. Discussed: Arrival time of 0830, reviewed Medication details, Need of responsible driver to drive home, Comfortable clothing. Mrs. Cravens states this is a follow up for hospitalization at [facility] last month. Questions answered.

## 2021-08-14 ENCOUNTER — Other Ambulatory Visit: Payer: Self-pay

## 2021-08-14 ENCOUNTER — Ambulatory Visit
Admission: RE | Admit: 2021-08-14 | Discharge: 2021-08-14 | Disposition: A | Discharge status: Home / Self Care | Payer: Medicare Other | Source: Ambulatory Visit | Attending: Neurosurgery | Admitting: Neurosurgery

## 2021-08-14 DIAGNOSIS — R296 Repeated falls: Secondary | ICD-10-CM | POA: Insufficient documentation

## 2021-08-14 HISTORY — DX: Essential (primary) hypertension: I10

## 2021-08-14 HISTORY — DX: Gastro-esophageal reflux disease without esophagitis: K21.9

## 2021-08-14 HISTORY — DX: Unspecified urinary incontinence: R32

## 2021-08-14 LAB — CSF CELL COUNT WITH DIFFERENTIAL
Eosinophils, CSF: 0 %
Lymphs, CSF: 78 %
Monocyte-Macrophage-Spinal Fluid: 18 %
RBC Count, CSF: 338 /mm3 — ABNORMAL HIGH (ref 0–3)
Segmented Neutrophils-CSF: 4 %
Tube #: 1
WBC, CSF: 24 /mm3 (ref 0–5)

## 2021-08-14 LAB — PROTEIN, CSF: Total  Protein, CSF: 25 mg/dL (ref 15–45)

## 2021-08-14 IMAGING — RF DG FLUORO GUIDE SPINAL/SI JT INJ*L*
1 series · 3 of 3 positions shown · non-contrast
Comparison: None.

CLINICAL DATA: Frequent falls

EXAM:
DIAGNOSTIC LUMBAR PUNCTURE UNDER FLUOROSCOPIC GUIDANCE

[Series 1: cp_standard · 0.25mm/px · 3 of 3 frames shown]
[frame 1/3]
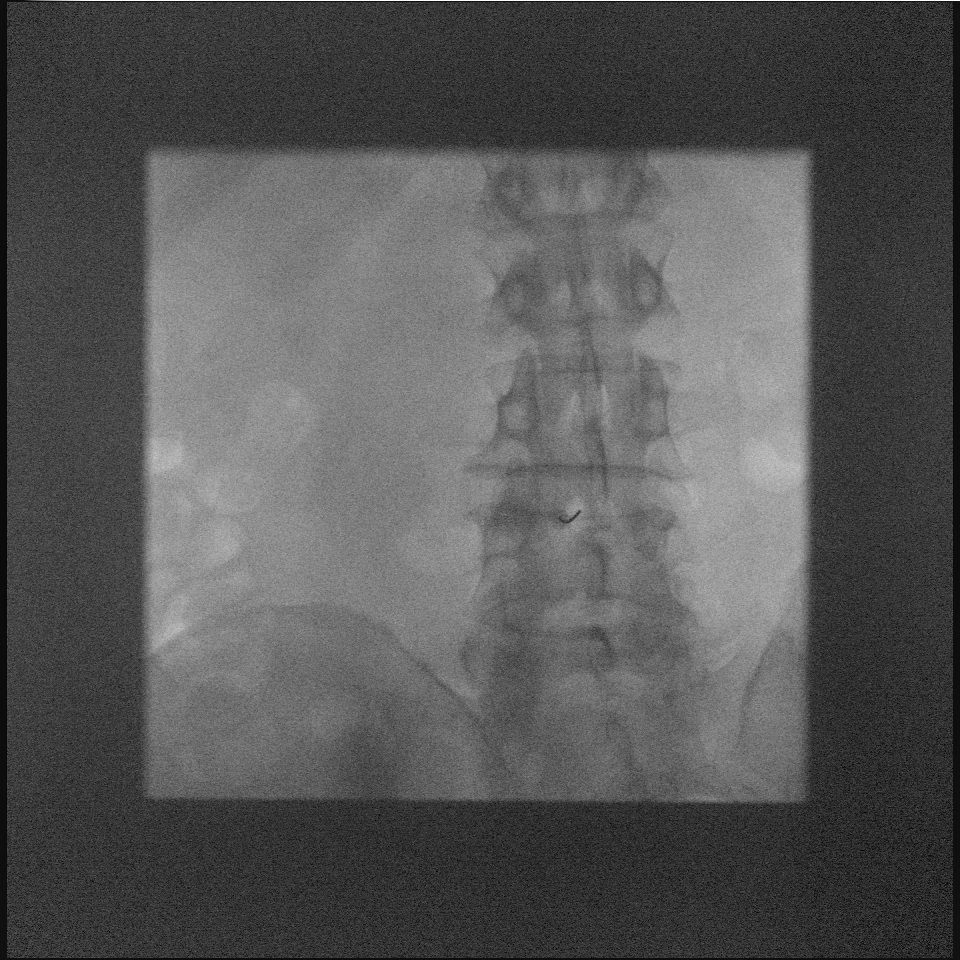
[frame 2/3]
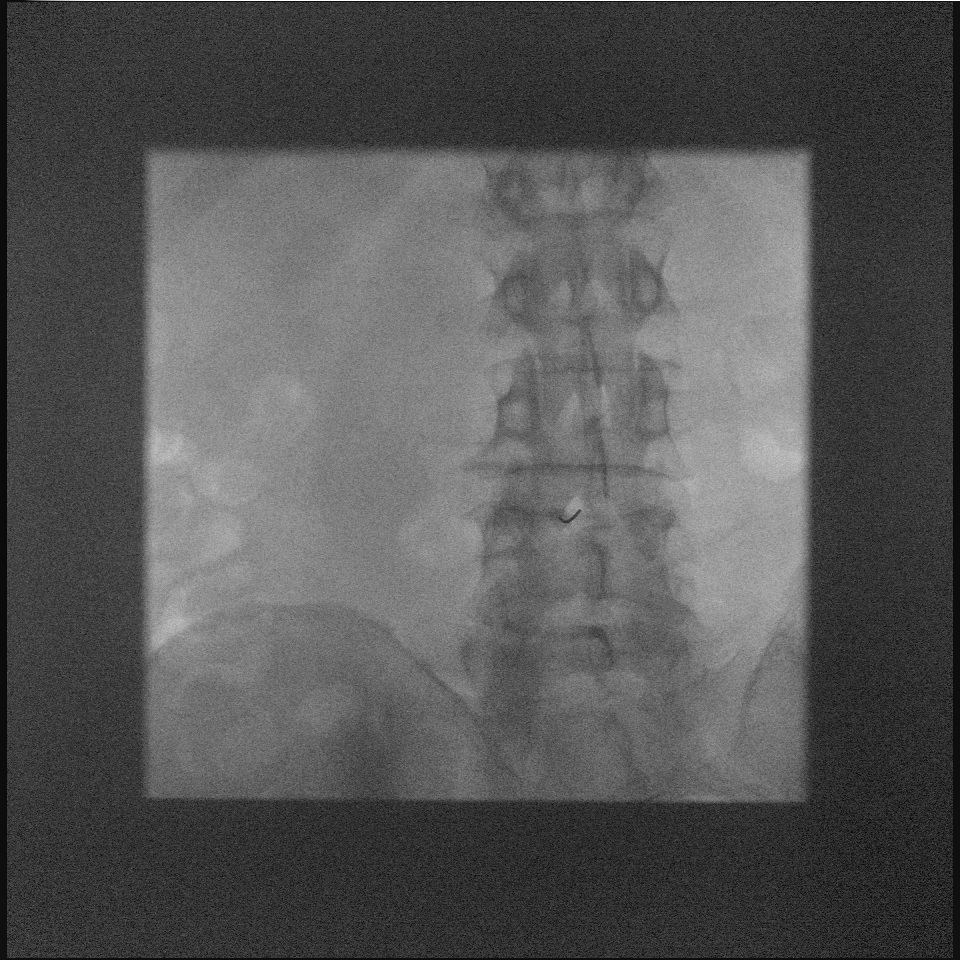
[frame 3/3]
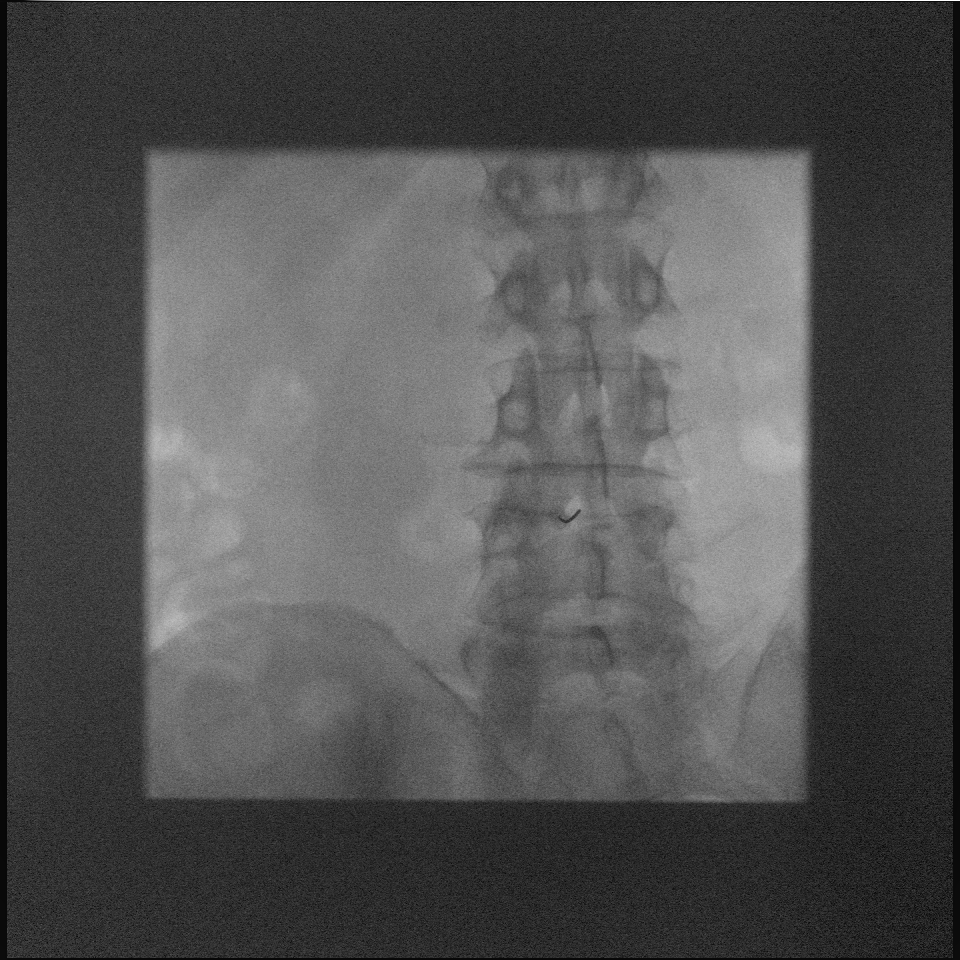

[3 of 3 positions shown; findings below may reference images not displayed]

FLUOROSCOPY TIME:  Fluoroscopy Time:  18 seconds

Radiation Exposure Index (if provided by the fluoroscopic device):
2.9 mGy

Number of Acquired Spot Images: None.

PROCEDURE:
Informed consent was obtained from the patient prior to the
procedure, including potential complications of headache, allergy,
and pain. With the patient prone, the lower back was prepped with
Betadine. 1% Lidocaine was used for local anesthesia. Lumbar
puncture was performed at the L3-L4 level using a 20 gauge needle
with return of clear CSF with an opening pressure of 22 cm water. 9
ml of CSF were obtained for laboratory studies; as the return of CSF
was very slow. The patient tolerated the procedure well and there
were no apparent complications.
IMPRESSION: Successful fluoroscopy guided lumbar puncture; opening pressure 22
cm of water.

## 2021-08-14 MED ORDER — LIDOCAINE HCL (PF) 1 % IJ SOLN
10.0000 mL | Freq: Once | INTRAMUSCULAR | Status: AC
  Administered 2021-08-14: 7 mL via INTRADERMAL
  Filled 2021-08-14: qty 10

## 2021-08-17 LAB — CYTOLOGY - NON PAP

## 2021-08-17 LAB — BODY FLUID CULTURE W GRAM STAIN: Culture: NO GROWTH

## 2021-08-17 LAB — COMP PANEL: LEUKEMIA/LYMPHOMA

## 2021-08-19 ENCOUNTER — Encounter: Payer: Self-pay | Admitting: Oncology

## 2021-08-19 ENCOUNTER — Other Ambulatory Visit: Payer: Self-pay

## 2021-08-19 ENCOUNTER — Inpatient Hospital Stay: Payer: Medicare Other

## 2021-08-19 ENCOUNTER — Inpatient Hospital Stay: Payer: Medicare Other | Attending: Oncology | Admitting: Oncology

## 2021-08-19 VITALS — BP 117/75 | HR 60 | Temp 97.0°F | Resp 16 | Wt 230.2 lb

## 2021-08-19 DIAGNOSIS — R778 Other specified abnormalities of plasma proteins: Secondary | ICD-10-CM

## 2021-08-19 DIAGNOSIS — R7989 Other specified abnormal findings of blood chemistry: Secondary | ICD-10-CM | POA: Diagnosis present

## 2021-08-19 LAB — CBC WITH DIFFERENTIAL/PLATELET
Abs Immature Granulocytes: 0.02 10*3/uL (ref 0.00–0.07)
Basophils Absolute: 0.1 10*3/uL (ref 0.0–0.1)
Basophils Relative: 1 %
Eosinophils Absolute: 0.1 10*3/uL (ref 0.0–0.5)
Eosinophils Relative: 2 %
HCT: 40 % (ref 39.0–52.0)
Hemoglobin: 13.5 g/dL (ref 13.0–17.0)
Immature Granulocytes: 0 %
Lymphocytes Relative: 25 %
Lymphs Abs: 1.4 10*3/uL (ref 0.7–4.0)
MCH: 28.1 pg (ref 26.0–34.0)
MCHC: 33.8 g/dL (ref 30.0–36.0)
MCV: 83.3 fL (ref 80.0–100.0)
Monocytes Absolute: 0.6 10*3/uL (ref 0.1–1.0)
Monocytes Relative: 10 %
Neutro Abs: 3.4 10*3/uL (ref 1.7–7.7)
Neutrophils Relative %: 62 %
Platelets: 251 10*3/uL (ref 150–400)
RBC: 4.8 MIL/uL (ref 4.22–5.81)
RDW: 13.3 % (ref 11.5–15.5)
WBC: 5.5 10*3/uL (ref 4.0–10.5)
nRBC: 0 % (ref 0.0–0.2)

## 2021-08-19 LAB — TECHNOLOGIST SMEAR REVIEW: Plt Morphology: ADEQUATE

## 2021-08-19 NOTE — Progress Notes (Deleted)
Hematology/Oncology Consult note [facility] Telephone:([phone] [phone]   Patient Care Team: Leonel Ramsay, MD as PCP - General (Infectious Diseases)  REFERRING PROVIDER: Quintin Alto, MD  CHIEF COMPLAINTS/REASON FOR VISIT:  Evaluation of   HISTORY OF PRESENTING ILLNESS:   Robert Zamora is a  80 y.o.  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[place])    GERD (gastroesophageal reflux disease)    Hypertension    Urinary incontinence     SURGICAL HISTORY: Past Surgical History:  Procedure Laterality Date   CIRCUMCISION      SOCIAL HISTORY: Social History   Socioeconomic History   Marital status: Married    Spouse name: Not on file   Number of children: Not on file   Years of education: Not on file   Highest education level: Not on file  Occupational History   Not on file  Tobacco Use   Smoking status: Former   Smokeless tobacco: Never   Tobacco comments:    Quit 1980's   Substance and Sexual Activity   Alcohol use: Not Currently   Drug use: Never   Sexual activity: Not Currently  Other Topics Concern   Not on file  Social History Narrative   Not on file   Social Determinants of Health   Financial Resource Strain: Not on file  Food Insecurity: Not on file  Transportation Needs: Not on file  Physical Activity: Not on file  Stress: Not on file  Social Connections: Not on file  Intimate Partner Violence: Not on file    FAMILY HISTORY: Family History  Problem Relation Age of Onset   Cancer Neg Hx     ALLERGIES:  has No Known Allergies.  MEDICATIONS:  Current Outpatient Medications  Medication Sig Dispense Refill   aspirin 81 MG EC tablet Take by mouth.     buPROPion (WELLBUTRIN XL) 150 MG 24 hr tablet Take 1 tablet by mouth  every morning.     Cholecalciferol 25 MCG (1000 UT) capsule Take by mouth.     donepezil (ARICEPT) 10 MG tablet Take 1 tablet by mouth daily.     memantine (NAMENDA) 10 MG tablet Take by mouth.     olmesartan (BENICAR) 40 MG tablet Take by mouth.     omeprazole (PRILOSEC) 40 MG capsule Take 40 mg by mouth daily.     ondansetron (ZOFRAN ODT) 4 MG disintegrating tablet Take 1 tablet (4 mg total) by mouth every 8 (eight) hours as needed for nausea or vomiting. 20 tablet 0   tamsulosin (FLOMAX) 0.4 MG CAPS capsule Take 0.4 mg by mouth.     Turmeric (QC TUMERIC COMPLEX) 500 MG CAPS Take by mouth. Turmeric/Ginger tablet 1 QD     No current facility-administered medications for this visit.     PHYSICAL EXAMINATION: ECOG PERFORMANCE STATUS: {CHL ONC ECOG ON:[id number]} Vitals:   08/19/21 1513  BP: 117/75  Pulse: 60  Resp: 16  Temp: (!) 97 F (36.1 C)   Filed Weights   08/19/21 1513  Weight: 230 lb 3.2 oz (104.4 kg)    Physical Exam  LABORATORY DATA:  I have reviewed the data as listed Lab Results  Component Value Date   WBC 5.5 08/19/2021   HGB 13.5 08/19/2021   HCT  40.0 08/19/2021   MCV 83.3 08/19/2021   PLT 251 08/19/2021   Recent Labs    02/19/21 1406 08/10/21 1802  NA 139 137  K 4.6 4.2  CL 109 107  CO2 22 24  GLUCOSE 102* 107*  BUN 15 16  CREATININE 1.73* 1.86*  CALCIUM 9.2 8.5*  GFRNONAA 39* 36*  PROT 7.9 7.7  ALBUMIN 3.6 3.8  AST 19 20  ALT 16 17  ALKPHOS 58 62  BILITOT 1.3* 1.3*   Iron/TIBC/Ferritin/ %Sat No results found for: IRON, TIBC, FERRITIN, IRONPCTSAT    RADIOGRAPHIC STUDIES: I have personally reviewed the radiological images as listed and agreed with the findings in the report. DG Chest 2 View  Result Date: 08/10/2021 CLINICAL DATA:  Altered mental status EXAM: CHEST - 2 VIEW COMPARISON:  02/19/2021 FINDINGS: Cardiac shadow is stable. Tortuous thoracic aorta is noted. Lungs are well aerated bilaterally. Minimal basilar atelectasis is noted  bilaterally. No bony abnormality is seen. IMPRESSION: Mild bibasilar atelectasis. Electronically Signed   By: Inez Catalina M.D.   On: 08/10/2021 19:14   CT HEAD WO CONTRAST (5MM)  Result Date: 08/06/2021 CLINICAL DATA:  Fall EXAM: CT HEAD WITHOUT CONTRAST TECHNIQUE: Contiguous axial images were obtained from the base of the skull through the vertex without intravenous contrast. COMPARISON:  02/19/2021 FINDINGS: Brain: There is atrophy and chronic small vessel disease changes. Associated ventriculomegaly, stable. No acute intracranial abnormality. Specifically, no hemorrhage, hydrocephalus, mass lesion, acute infarction, or significant intracranial injury. Vascular: No hyperdense vessel or unexpected calcification. Skull: No acute calvarial abnormality. Sinuses/Orbits: No acute findings Other: None IMPRESSION: Atrophy, chronic microvascular disease. No acute intracranial abnormality. Electronically Signed   By: Rolm Baptise M.D.   On: 08/06/2021 09:08   CT HEAD CODE STROKE WO CONTRAST  Result Date: 08/10/2021 CLINICAL DATA:  Code stroke.  Altered mental status EXAM: CT HEAD WITHOUT CONTRAST TECHNIQUE: Contiguous axial images were obtained from the base of the skull through the vertex without intravenous contrast. COMPARISON:  08/05/2021 FINDINGS: Brain: Redemonstrated cerebral atrophy and associated ex vacuo dilatation of the ventricles, unchanged. No evidence of acute infarction, hemorrhage, cerebral edema, mass, mass effect, or midline shift. Confluent periventricular white matter changes, likely the sequela of severe chronic small vessel ischemic disease. No extra-axial fluid collection. Vascular: No hyperdense vessel. Calcifications in the intracranial carotid arteries. Skull: Normal. Negative for fracture or focal lesion. Sinuses/Orbits: No acute finding. Status post right lens replacement. Other: The mastoid air cells are well aerated. ASPECTS [facility] Stroke Program Early CT Score) - Ganglionic level  infarction (caudate, lentiform nuclei, internal capsule, insula, M1-M3 cortex): 7 - Supraganglionic infarction (M4-M6 cortex): 3 Total score (0-10 with 10 being normal): 10 IMPRESSION: 1. No acute intracranial process. 2. ASPECTS is 10 Code stroke imaging results were communicated on 08/10/2021 at 6:05 pm to provider Dr. Hulan Saas via telephone, who verbally acknowledged these results. Electronically Signed   By: Merilyn Baba M.D.   On: 08/10/2021 18:05   DG FLUORO GUIDED LOC OF NEEDLE/CATH TIP FOR SPINAL INJECT LT  Result Date: 08/14/2021 CLINICAL DATA:  Frequent falls EXAM: DIAGNOSTIC LUMBAR PUNCTURE UNDER FLUOROSCOPIC GUIDANCE COMPARISON:  None. FLUOROSCOPY TIME:  Fluoroscopy Time:  18 seconds Radiation Exposure Index (if provided by the fluoroscopic device): 2.9 mGy Number of Acquired Spot Images: None. PROCEDURE: Informed consent was obtained from the patient prior to the procedure, including potential complications of headache, allergy, and pain. With the patient prone, the lower back was prepped with Betadine. 1% Lidocaine was used for  local anesthesia. Lumbar puncture was performed at the L3-L4 level using a 20 gauge needle with return of clear CSF with an opening pressure of 22 cm water. 9 ml of CSF were obtained for laboratory studies; as the return of CSF was very slow. The patient tolerated the procedure well and there were no apparent complications. IMPRESSION: Successful fluoroscopy guided lumbar puncture; opening pressure 22 cm of water. Electronically Signed   By: Valetta Mole M.D.   On: 08/14/2021 12:32      ASSESSMENT & PLAN:  1. Abnormal SPEP      Orders Placed This Encounter  Procedures   Multiple Myeloma Panel (SPEP&IFE w/QIG)    Standing Status:   Future    Number of Occurrences:   1    Standing Expiration Date:   08/19/2022   Kappa/lambda light chains    Standing Status:   Future    Number of Occurrences:   1    Standing Expiration Date:   08/19/2022   CBC with  Differential/Platelet    Standing Status:   Future    Number of Occurrences:   1    Standing Expiration Date:   08/19/2022   Technologist smear review    Standing Status:   Future    Number of Occurrences:   1    Standing Expiration Date:   08/19/2022   IFE+PROTEIN ELECTRO, 24-HR UR    Standing Status:   Future    Standing Expiration Date:   08/19/2022    All questions were answered. The patient knows to call the clinic with any problems questions or concerns.   Quintin Alto, MD    Return of visit:  Thank you for this kind referral and the opportunity to participate in the care of this patient. A copy of today's note is routed to referring provider    Earlie Server, MD, PhD Hematology Oncology [place] at [facility]  08/19/2021

## 2021-08-19 NOTE — Progress Notes (Signed)
New patient evaluation.   

## 2021-08-20 LAB — KAPPA/LAMBDA LIGHT CHAINS
Kappa free light chain: 49.4 mg/L — ABNORMAL HIGH (ref 3.3–19.4)
Kappa, lambda light chain ratio: 0.79 (ref 0.26–1.65)
Lambda free light chains: 62.5 mg/L — ABNORMAL HIGH (ref 5.7–26.3)

## 2021-08-21 NOTE — Progress Notes (Signed)
Hematology/Oncology Consult note [facility] [phone] [phone]   Patient Care Team: Leonel Ramsay, MD as PCP - General (Infectious Diseases)  REFERRING PROVIDER: Quintin Alto, MD  CHIEF COMPLAINTS/REASON FOR VISIT:  Evaluation of abnormal SPEP  HISTORY OF PRESENTING ILLNESS:  Robert Zamora is a 80 y.o. male who was seen in consultation at the request of Quintin Alto, MD for evaluation of abnormal SPEP results.   Patient established care with rheumatology Dr.Patel for evaluation of elevated ESR and CRP Work up includes SPEP showed no M spike,  elevated IgG level. Patient has chronic ongoing balancing issues, incontinence, daytime somnolence, confusion. He follows up with neurology and neurosurgery. He was diagnosed with mixed Alzheimer's and vascular dementia since 2019.  Wife is concerned that patient's symptoms are getting worse. He was recent admitted 07/27/2021 for a lumbar drain placement, CSF showed increased wbc,rbc, increased neutrophil counts, decreased lymphocytes.  Repeat lumbar puncture on 08/14/2021, cytology is negative for malignancy, increased wbc, rbc , normal protein, flowcytometry showed insufficient cells for analysis, culture showed no growth  08/05/2021 CT head showed atrophy, chronic microvascular disease.   Review of Systems  Constitutional:  Positive for fatigue. Negative for appetite change, chills, fever and unexpected weight change.  HENT:   Negative for hearing loss and voice change.   Eyes:  Negative for eye problems and icterus.  Respiratory:  Negative for chest tightness, cough and shortness of breath.   Cardiovascular:  Negative for chest pain and leg swelling.  Gastrointestinal:  Negative for abdominal distention and abdominal pain.  Endocrine: Negative for hot flashes.  Genitourinary:  Positive for bladder incontinence. Negative for difficulty urinating, dysuria and frequency.    Musculoskeletal:  Negative for arthralgias.  Skin:  Negative for itching and rash.  Neurological:        Balancing problem.    Hematological:  Negative for adenopathy. Does not bruise/bleed easily.  Psychiatric/Behavioral:  Positive for confusion.     MEDICAL HISTORY:  Past Medical History:  Diagnosis Date   Alzheimer disease ([facility])    GERD (gastroesophageal reflux disease)    Hypertension    Urinary incontinence     SURGICAL HISTORY: Past Surgical History:  Procedure Laterality Date   CIRCUMCISION      SOCIAL HISTORY: Social History   Socioeconomic History   Marital status: Married    Spouse name: Not on file   Number of children: Not on file   Years of education: Not on file   Highest education level: Not on file  Occupational History   Not on file  Tobacco Use   Smoking status: Former   Smokeless tobacco: Never   Tobacco comments:    Quit 1980's   Substance and Sexual Activity   Alcohol use: Not Currently   Drug use: Never   Sexual activity: Not Currently  Other Topics Concern   Not on file  Social History Narrative   Not on file   Social Determinants of Health   Financial Resource Strain: Not on file  Food Insecurity: Not on file  Transportation Needs: Not on file  Physical Activity: Not on file  Stress: Not on file  Social Connections: Not on file  Intimate Partner Violence: Not on file    FAMILY HISTORY: Family History  Problem Relation Age of Onset   Cancer Neg Hx     ALLERGIES:  has No Known Allergies.  MEDICATIONS:  Current Outpatient Medications  Medication Sig Dispense Refill   aspirin 81 MG  EC tablet Take by mouth.     buPROPion (WELLBUTRIN XL) 150 MG 24 hr tablet Take 1 tablet by mouth every morning.     Cholecalciferol 25 MCG (1000 UT) capsule Take by mouth.     donepezil (ARICEPT) 10 MG tablet Take 1 tablet by mouth daily.     memantine (NAMENDA) 10 MG tablet Take by mouth.     olmesartan (BENICAR) 40 MG tablet Take by mouth.      omeprazole (PRILOSEC) 40 MG capsule Take 40 mg by mouth daily.     ondansetron (ZOFRAN ODT) 4 MG disintegrating tablet Take 1 tablet (4 mg total) by mouth every 8 (eight) hours as needed for nausea or vomiting. 20 tablet 0   tamsulosin (FLOMAX) 0.4 MG CAPS capsule Take 0.4 mg by mouth.     Turmeric (QC TUMERIC COMPLEX) 500 MG CAPS Take by mouth. Turmeric/Ginger tablet 1 QD     No current facility-administered medications for this visit.     PHYSICAL EXAMINATION: ECOG PERFORMANCE STATUS: 2 - Symptomatic, <50% confined to bed Vitals:   08/19/21 1513  BP: 117/75  Pulse: 60  Resp: 16  Temp: (!) 97 F (36.1 C)   Filed Weights   08/19/21 1513  Weight: 230 lb 3.2 oz (104.4 kg)    Physical Exam Constitutional:      General: He is not in acute distress.    Comments: He sits in the wheel chair  HENT:     Head: Normocephalic and atraumatic.  Eyes:     General: No scleral icterus. Cardiovascular:     Rate and Rhythm: Normal rate and regular rhythm.     Heart sounds: Normal heart sounds.  Pulmonary:     Effort: Pulmonary effort is normal. No respiratory distress.     Breath sounds: No wheezing.  Abdominal:     General: Bowel sounds are normal. There is no distension.     Palpations: Abdomen is soft.  Musculoskeletal:        General: No deformity. Normal range of motion.     Cervical back: Normal range of motion and neck supple.  Skin:    General: Skin is warm and dry.     Findings: No erythema or rash.  Neurological:     Mental Status: He is alert and oriented to person, place, and time. Mental status is at baseline.  Psychiatric:        Mood and Affect: Mood normal.     LABORATORY DATA:  I have reviewed the data as listed Lab Results  Component Value Date   WBC 5.5 08/19/2021   HGB 13.5 08/19/2021   HCT 40.0 08/19/2021   MCV 83.3 08/19/2021   PLT 251 08/19/2021   Recent Labs    02/19/21 1406 08/10/21 1802  NA 139 137  K 4.6 4.2  CL 109 107  CO2 22 24   GLUCOSE 102* 107*  BUN 15 16  CREATININE 1.73* 1.86*  CALCIUM 9.2 8.5*  GFRNONAA 39* 36*  PROT 7.9 7.7  ALBUMIN 3.6 3.8  AST 19 20  ALT 16 17  ALKPHOS 58 62  BILITOT 1.3* 1.3*   Iron/TIBC/Ferritin/ %Sat No results found for: IRON, TIBC, FERRITIN, IRONPCTSAT    RADIOGRAPHIC STUDIES: I have personally reviewed the radiological images as listed and agreed with the findings in the report.  DG Chest 2 View  Result Date: 08/10/2021 CLINICAL DATA:  Altered mental status EXAM: CHEST - 2 VIEW COMPARISON:  02/19/2021 FINDINGS: Cardiac shadow is stable. Tortuous  thoracic aorta is noted. Lungs are well aerated bilaterally. Minimal basilar atelectasis is noted bilaterally. No bony abnormality is seen. IMPRESSION: Mild bibasilar atelectasis. Electronically Signed   By: Inez Catalina M.D.   On: 08/10/2021 19:14   CT HEAD WO CONTRAST (5MM)  Result Date: 08/06/2021 CLINICAL DATA:  Fall EXAM: CT HEAD WITHOUT CONTRAST TECHNIQUE: Contiguous axial images were obtained from the base of the skull through the vertex without intravenous contrast. COMPARISON:  02/19/2021 FINDINGS: Brain: There is atrophy and chronic small vessel disease changes. Associated ventriculomegaly, stable. No acute intracranial abnormality. Specifically, no hemorrhage, hydrocephalus, mass lesion, acute infarction, or significant intracranial injury. Vascular: No hyperdense vessel or unexpected calcification. Skull: No acute calvarial abnormality. Sinuses/Orbits: No acute findings Other: None IMPRESSION: Atrophy, chronic microvascular disease. No acute intracranial abnormality. Electronically Signed   By: Rolm Baptise M.D.   On: 08/06/2021 09:08   CT HEAD CODE STROKE WO CONTRAST  Result Date: 08/10/2021 CLINICAL DATA:  Code stroke.  Altered mental status EXAM: CT HEAD WITHOUT CONTRAST TECHNIQUE: Contiguous axial images were obtained from the base of the skull through the vertex without intravenous contrast. COMPARISON:  08/05/2021  FINDINGS: Brain: Redemonstrated cerebral atrophy and associated ex vacuo dilatation of the ventricles, unchanged. No evidence of acute infarction, hemorrhage, cerebral edema, mass, mass effect, or midline shift. Confluent periventricular white matter changes, likely the sequela of severe chronic small vessel ischemic disease. No extra-axial fluid collection. Vascular: No hyperdense vessel. Calcifications in the intracranial carotid arteries. Skull: Normal. Negative for fracture or focal lesion. Sinuses/Orbits: No acute finding. Status post right lens replacement. Other: The mastoid air cells are well aerated. ASPECTS [facility] Stroke Program Early CT Score) - Ganglionic level infarction (caudate, lentiform nuclei, internal capsule, insula, M1-M3 cortex): 7 - Supraganglionic infarction (M4-M6 cortex): 3 Total score (0-10 with 10 being normal): 10 IMPRESSION: 1. No acute intracranial process. 2. ASPECTS is 10 Code stroke imaging results were communicated on 08/10/2021 at 6:05 pm to provider Dr. Hulan Saas via telephone, who verbally acknowledged these results. Electronically Signed   By: Merilyn Baba M.D.   On: 08/10/2021 18:05   DG FLUORO GUIDED LOC OF NEEDLE/CATH TIP FOR SPINAL INJECT LT  Result Date: 08/14/2021 CLINICAL DATA:  Frequent falls EXAM: DIAGNOSTIC LUMBAR PUNCTURE UNDER FLUOROSCOPIC GUIDANCE COMPARISON:  None. FLUOROSCOPY TIME:  Fluoroscopy Time:  18 seconds Radiation Exposure Index (if provided by the fluoroscopic device): 2.9 mGy Number of Acquired Spot Images: None. PROCEDURE: Informed consent was obtained from the patient prior to the procedure, including potential complications of headache, allergy, and pain. With the patient prone, the lower back was prepped with Betadine. 1% Lidocaine was used for local anesthesia. Lumbar puncture was performed at the L3-L4 level using a 20 gauge needle with return of clear CSF with an opening pressure of 22 cm water. 9 ml of CSF were obtained for laboratory  studies; as the return of CSF was very slow. The patient tolerated the procedure well and there were no apparent complications. IMPRESSION: Successful fluoroscopy guided lumbar puncture; opening pressure 22 cm of water. Electronically Signed   By: Valetta Mole M.D.   On: 08/14/2021 12:32     ASSESSMENT & PLAN:  1. Abnormal SPEP    Previous labs are reviewed and discussed with patient.  I recommend checking cbc, multiple myeloma panel light chain ratio, UPEP, UFE.   Chronic balancing problems/incontinence/dementia, I will defer to neurology and neurosurgeon for management.    Orders Placed This Encounter  Procedures   Multiple Myeloma  Panel (SPEP&IFE w/QIG)    Standing Status:   Future    Number of Occurrences:   1    Standing Expiration Date:   08/19/2022   Kappa/lambda light chains    Standing Status:   Future    Number of Occurrences:   1    Standing Expiration Date:   08/19/2022   CBC with Differential/Platelet    Standing Status:   Future    Number of Occurrences:   1    Standing Expiration Date:   08/19/2022   Technologist smear review    Standing Status:   Future    Number of Occurrences:   1    Standing Expiration Date:   08/19/2022   IFE+PROTEIN ELECTRO, 24-HR UR    Standing Status:   Future    Standing Expiration Date:   08/19/2022    All questions were answered. The patient knows to call the clinic with any problems questions or concerns.  Cc Posey Pronto, Mayur K, MD  Return of visit: 2 weeks Thank you for this kind referral and the opportunity to participate in the care of this patient. A copy of today's note is routed to referring provider   Earlie Server, MD, PhD 08/21/2021

## 2021-08-24 DIAGNOSIS — R778 Other specified abnormalities of plasma proteins: Secondary | ICD-10-CM | POA: Diagnosis not present

## 2021-08-24 LAB — MULTIPLE MYELOMA PANEL, SERUM
Albumin SerPl Elph-Mcnc: 3.4 g/dL (ref 2.9–4.4)
Albumin/Glob SerPl: 1 (ref 0.7–1.7)
Alpha 1: 0.2 g/dL (ref 0.0–0.4)
Alpha2 Glob SerPl Elph-Mcnc: 0.8 g/dL (ref 0.4–1.0)
B-Globulin SerPl Elph-Mcnc: 1.1 g/dL (ref 0.7–1.3)
Gamma Glob SerPl Elph-Mcnc: 1.5 g/dL (ref 0.4–1.8)
Globulin, Total: 3.7 g/dL (ref 2.2–3.9)
IgA: 336 mg/dL (ref 61–437)
IgG (Immunoglobin G), Serum: 1744 mg/dL — ABNORMAL HIGH (ref 603–1613)
IgM (Immunoglobulin M), Srm: 62 mg/dL (ref 15–143)
Total Protein ELP: 7.1 g/dL (ref 6.0–8.5)

## 2021-08-25 ENCOUNTER — Other Ambulatory Visit: Payer: Self-pay

## 2021-08-25 DIAGNOSIS — R778 Other specified abnormalities of plasma proteins: Secondary | ICD-10-CM

## 2021-08-26 LAB — IFE+PROTEIN ELECTRO, 24-HR UR
% BETA, Urine: 0 %
ALPHA 1 URINE: 0 %
Albumin, U: 100 %
Alpha 2, Urine: 0 %
GAMMA GLOBULIN URINE: 0 %
Total Protein, Urine-Ur/day: 173 mg/(24.h) — ABNORMAL HIGH (ref 30–150)
Total Protein, Urine: 20.4 mg/dL
Total Volume: 850

## 2021-09-09 ENCOUNTER — Ambulatory Visit (INDEPENDENT_AMBULATORY_CARE_PROVIDER_SITE_OTHER): Payer: Medicare Other | Admitting: Urology

## 2021-09-09 ENCOUNTER — Encounter: Payer: Self-pay | Admitting: Urology

## 2021-09-09 ENCOUNTER — Other Ambulatory Visit: Payer: Self-pay

## 2021-09-09 VITALS — BP 119/71 | HR 75 | Ht 75.0 in | Wt 235.0 lb

## 2021-09-09 DIAGNOSIS — N401 Enlarged prostate with lower urinary tract symptoms: Secondary | ICD-10-CM | POA: Diagnosis not present

## 2021-09-09 DIAGNOSIS — N3281 Overactive bladder: Secondary | ICD-10-CM

## 2021-09-09 LAB — URINALYSIS, COMPLETE
Bilirubin, UA: NEGATIVE
Glucose, UA: NEGATIVE
Ketones, UA: NEGATIVE
Nitrite, UA: NEGATIVE
RBC, UA: NEGATIVE
Specific Gravity, UA: 1.015 (ref 1.005–1.030)
Urobilinogen, Ur: 1 mg/dL (ref 0.2–1.0)
pH, UA: 6.5 (ref 5.0–7.5)

## 2021-09-09 LAB — MICROSCOPIC EXAMINATION: Bacteria, UA: NONE SEEN

## 2021-09-09 LAB — BLADDER SCAN AMB NON-IMAGING

## 2021-09-09 MED ORDER — MIRABEGRON ER 50 MG PO TB24: 50.0000 mg | ORAL_TABLET | Freq: Every day | ORAL | 0 refills | Status: DC

## 2021-09-09 NOTE — Progress Notes (Signed)
   09/09/21 2:29 PM   Robert Zamora February 19, 1941 [id number]  CC: Urinary urgency/frequency, incontinence  HPI: 80 year old male who presents with his wife for 5+ years of urinary urgency, frequency, and incontinence.  This has worsened over the last year or so.  He wears multiple depends during the day, and leaks overnight as well.  He has urge incontinence during the day.  When he does urinate he has a good stream.  His wife helps with most of the history today.  He denies any gross hematuria or UTIs.  He has Alzheimer's and some dementia, and has had extensive work-up with neurology and neurosurgery previously.  He does drink some soda and diet soda during the day.  Urinalysis today with 11-30 WBCs, 0-2 RBCs, no bacteria, nitrite negative, trace leukocytes, no glucose.  PVR normal at 50 mL.  PSA 03/2021 normal at 2.79   PMH: Past Medical History:  Diagnosis Date   Alzheimer disease ([place])    GERD (gastroesophageal reflux disease)    Hypertension    Urinary incontinence     Surgical History: Past Surgical History:  Procedure Laterality Date   CIRCUMCISION        Family History: Family History  Problem Relation Age of Onset   Cancer Neg Hx     Social History:  reports that he has quit smoking. He has never used smokeless tobacco. He reports that he does not currently use alcohol. He reports that he does not use drugs.  Physical Exam: BP 119/71 (BP Location: Right Arm, Patient Position: Sitting, Cuff Size: Large)   Pulse 75   Ht 6\' 3"  (1.905 m)   Wt 235 lb (106.6 kg)   BMI 29.37 kg/m    Constitutional: Flat affect, in wheelchair Cardiovascular: No clubbing, cyanosis, or edema. Respiratory: Normal respiratory effort, no increased work of breathing. GI: Abdomen is soft, nontender, nondistended, no abdominal masses   Laboratory Data: Reviewed, see HPI  Pertinent Imaging: No cross-sectional imaging to review  Assessment & Plan:   Comorbid 80 year old male with  Alzheimer's and dementia and 5+ years of overactive symptoms of urgency, frequency, and incontinence requiring multiple depends during the day and night.  We discussed that overactive bladder (OAB) is not a disease, but is a symptom complex that is generally not life-threatening.  Symptoms typically include urinary urgency, frequency, and urge incontinence.  There are numerous treatment options, however there are risks and benefits with both medical and surgical management.  First-line treatment is behavioral therapies including bladder training, pelvic floor muscle training, and fluid management.  Second line treatments include oral antimuscarinics(Ditropan er, Trospium) and beta-3 agonist (Mybetriq). There is typically a period of medication trial (4-8 weeks) to find the optimal therapy and dosing. If symptoms are bothersome despite the above management, third line options include intra-detrusor botox, peripheral tibial nerve stimulation (PTNS), and interstim (SNS). These are more invasive treatments with higher side effect profile, but may improve quality of life for patients with severe OAB symptoms.   -Behavioral strategies discussed regarding minimizing soda and bladder irritants -Follow-up urine culture -Trial of Myrbetriq 50 mg daily, okay to discontinue Flomax -Virtual visit 1 month symptom check -Consider cystoscopy in the future if no improvement on the Myrbetriq   Nickolas Madrid, MD 09/09/2021  [facility] [address] [place], [place] [address] [phone]

## 2021-09-09 NOTE — Patient Instructions (Signed)

## 2021-09-10 ENCOUNTER — Encounter: Payer: Self-pay | Admitting: Oncology

## 2021-09-10 ENCOUNTER — Inpatient Hospital Stay: Payer: Medicare Other | Attending: Oncology | Admitting: Oncology

## 2021-09-10 VITALS — BP 116/67 | HR 67 | Temp 97.2°F | Resp 18 | Wt 234.8 lb

## 2021-09-10 DIAGNOSIS — R803 Bence Jones proteinuria: Secondary | ICD-10-CM | POA: Diagnosis present

## 2021-09-10 DIAGNOSIS — D472 Monoclonal gammopathy: Secondary | ICD-10-CM

## 2021-09-10 NOTE — Progress Notes (Signed)
Hematology/Oncology progress note [facility] [phone] [phone]   Patient Care Team: Leonel Ramsay, MD as PCP - General (Infectious Diseases)  REFERRING PROVIDER: Leonel Ramsay, MD  CHIEF COMPLAINTS/REASON FOR VISIT:  Bence-Jones proteinuria  HISTORY OF PRESENTING ILLNESS:  Robert Zamora is a 80 y.o. male who was seen in consultation at the request of Leonel Ramsay, MD for evaluation of abnormal SPEP results.   Patient established care with rheumatology Dr.Patel for evaluation of elevated ESR and CRP Work up includes SPEP showed no M spike,  elevated IgG level. Patient has chronic ongoing balancing issues, incontinence, daytime somnolence, confusion. He follows up with neurology and neurosurgery. He was diagnosed with mixed Alzheimer's and vascular dementia since 2019.  Wife is concerned that patient's symptoms are getting worse. He was recent admitted 07/27/2021 for a lumbar drain placement, CSF showed increased wbc,rbc, increased neutrophil counts, decreased lymphocytes.  Repeat lumbar puncture on 08/14/2021, cytology is negative for malignancy, increased wbc, rbc , normal protein, flowcytometry showed insufficient cells for analysis, culture showed no growth  08/05/2021 CT head showed atrophy, chronic microvascular disease.   INTERVAL HISTORY Robert Zamora is a 80 y.o. male who has above history reviewed by me today presents for follow up visit to go over blood work that he has done for evaluation of abnormal protein electrophoresis. Patient was accompanied by his wife.  No new complaints.    Review of Systems  Constitutional:  Positive for fatigue. Negative for appetite change, chills, fever and unexpected weight change.  HENT:   Negative for hearing loss and voice change.   Eyes:  Negative for eye problems and icterus.  Respiratory:  Negative for chest tightness, cough and shortness of breath.   Cardiovascular:   Negative for chest pain and leg swelling.  Gastrointestinal:  Negative for abdominal distention and abdominal pain.  Endocrine: Negative for hot flashes.  Genitourinary:  Positive for bladder incontinence. Negative for difficulty urinating, dysuria and frequency.   Musculoskeletal:  Negative for arthralgias.  Skin:  Negative for itching and rash.  Neurological:        Balancing problem.    Hematological:  Negative for adenopathy. Does not bruise/bleed easily.  Psychiatric/Behavioral:  Positive for confusion.      MEDICAL HISTORY:  Past Medical History:  Diagnosis Date   Alzheimer disease ([facility])    GERD (gastroesophageal reflux disease)    Hypertension    Urinary incontinence     SURGICAL HISTORY: Past Surgical History:  Procedure Laterality Date   CIRCUMCISION      SOCIAL HISTORY: Social History   Socioeconomic History   Marital status: Married    Spouse name: Not on file   Number of children: Not on file   Years of education: Not on file   Highest education level: Not on file  Occupational History   Not on file  Tobacco Use   Smoking status: Former   Smokeless tobacco: Never   Tobacco comments:    Quit 1980's   Substance and Sexual Activity   Alcohol use: Not Currently   Drug use: Never   Sexual activity: Not Currently  Other Topics Concern   Not on file  Social History Narrative   Not on file   Social Determinants of Health   Financial Resource Strain: Not on file  Food Insecurity: Not on file  Transportation Needs: Not on file  Physical Activity: Not on file  Stress: Not on file  Social Connections: Not on file  Intimate  Partner Violence: Not on file    FAMILY HISTORY: Family History  Problem Relation Age of Onset   Cancer Neg Hx     ALLERGIES:  has No Known Allergies.  MEDICATIONS:  Current Outpatient Medications  Medication Sig Dispense Refill   aspirin 81 MG EC tablet Take by mouth.     buPROPion (WELLBUTRIN XL) 150 MG 24 hr tablet  Take 1 tablet by mouth every morning.     Cholecalciferol 25 MCG (1000 UT) capsule Take by mouth.     donepezil (ARICEPT) 10 MG tablet Take 1 tablet by mouth daily.     memantine (NAMENDA) 10 MG tablet Take by mouth.     mirabegron ER (MYRBETRIQ) 50 MG TB24 tablet Take 1 tablet (50 mg total) by mouth daily. 30 tablet 0   olmesartan (BENICAR) 40 MG tablet Take by mouth.     Turmeric 500 MG CAPS Take by mouth. Turmeric/Ginger tablet 1 QD     No current facility-administered medications for this visit.     PHYSICAL EXAMINATION: ECOG PERFORMANCE STATUS: 2 - Symptomatic, <50% confined to bed Vitals:   09/10/21 1505  BP: 116/67  Pulse: 67  Resp: 18  Temp: (!) 97.2 F (36.2 C)   Filed Weights   09/10/21 1505  Weight: 234 lb 12.8 oz (106.5 kg)    Physical Exam Constitutional:      General: He is not in acute distress.    Comments: He sits in the wheel chair  HENT:     Head: Normocephalic and atraumatic.  Eyes:     General: No scleral icterus. Cardiovascular:     Rate and Rhythm: Normal rate and regular rhythm.     Heart sounds: Normal heart sounds.  Pulmonary:     Effort: Pulmonary effort is normal. No respiratory distress.     Breath sounds: No wheezing.  Abdominal:     General: Bowel sounds are normal. There is no distension.     Palpations: Abdomen is soft.  Musculoskeletal:        General: No deformity. Normal range of motion.     Cervical back: Normal range of motion and neck supple.  Skin:    General: Skin is warm and dry.     Findings: No erythema or rash.  Neurological:     Mental Status: He is alert and oriented to person, place, and time. Mental status is at baseline.  Psychiatric:        Mood and Affect: Mood normal.     LABORATORY DATA:  I have reviewed the data as listed Lab Results  Component Value Date   WBC 5.5 08/19/2021   HGB 13.5 08/19/2021   HCT 40.0 08/19/2021   MCV 83.3 08/19/2021   PLT 251 08/19/2021   Recent Labs    02/19/21 1406  08/10/21 1802  NA 139 137  K 4.6 4.2  CL 109 107  CO2 22 24  GLUCOSE 102* 107*  BUN 15 16  CREATININE 1.73* 1.86*  CALCIUM 9.2 8.5*  GFRNONAA 39* 36*  PROT 7.9 7.7  ALBUMIN 3.6 3.8  AST 19 20  ALT 16 17  ALKPHOS 58 62  BILITOT 1.3* 1.3*    Iron/TIBC/Ferritin/ %Sat No results found for: IRON, TIBC, FERRITIN, IRONPCTSAT    RADIOGRAPHIC STUDIES: I have personally reviewed the radiological images as listed and agreed with the findings in the report.  DG FLUORO GUIDED LOC OF NEEDLE/CATH TIP FOR SPINAL INJECT LT  Result Date: 08/14/2021 CLINICAL DATA:  Frequent falls  EXAM: DIAGNOSTIC LUMBAR PUNCTURE UNDER FLUOROSCOPIC GUIDANCE COMPARISON:  None. FLUOROSCOPY TIME:  Fluoroscopy Time:  18 seconds Radiation Exposure Index (if provided by the fluoroscopic device): 2.9 mGy Number of Acquired Spot Images: None. PROCEDURE: Informed consent was obtained from the patient prior to the procedure, including potential complications of headache, allergy, and pain. With the patient prone, the lower back was prepped with Betadine. 1% Lidocaine was used for local anesthesia. Lumbar puncture was performed at the L3-L4 level using a 20 gauge needle with return of clear CSF with an opening pressure of 22 cm water. 9 ml of CSF were obtained for laboratory studies; as the return of CSF was very slow. The patient tolerated the procedure well and there were no apparent complications. IMPRESSION: Successful fluoroscopy guided lumbar puncture; opening pressure 22 cm of water. Electronically Signed   By: Valetta Mole M.D.   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 Multiple myeloma panel of serum showed negative M protein.  Elevated IgG, immunofixation showed polyclonal increase of immunoglobulin this is consistent with chronic  inflammation. Free light chain ratios are normal.  Elevation of both kappa and lambda free light chain.  Consistent with chronic inflammation. Urine protein electrophoresis and immunofixation showed no M protein, very faint band of lambda type Bence-Jones protein.   Urine 24 hour M protein is not observed, < 0.5g, not meeting lightchain SMM/Bence Jones proteinuria definition. - light chain MGUS. I do not think that this is related to patient's neurologic symptoms. I discussed with patient about the diagnosis of light chain MGUS which is an asymptomatic condition which has a small risk of progression to smoldering multiple myeloma and to symptomatic multiple myeloma. Less frequently, these patients progress to AL amyloidosis, light chain deposition disease, or another lymphoproliferative disorder.  For now I recommend observation.  Repeat blood work in 6 months.    Chronic balancing problems/incontinence/dementia, I will defer to neurology and neurosurgeon for management.    Orders Placed This Encounter  Procedures   IFE+PROTEIN ELECTRO, 24-HR UR    Standing Status:   Future    Standing Expiration Date:   09/10/2022   CBC with Differential    Standing Status:   Future    Standing Expiration Date:   09/10/2022   Comprehensive metabolic panel    Standing Status:   Future    Standing Expiration Date:   09/10/2022   Multiple Myeloma Panel (SPEP&IFE w/QIG)    Standing Status:   Future    Standing Expiration Date:   09/10/2022   Kappa/lambda light chains    Standing Status:   Future    Standing Expiration Date:   09/10/2022    All questions were answered. The patient knows to call the clinic with any problems questions or concerns.  Cc Leonel Ramsay, MD  Return of visit: 2 weeks Thank you for this kind referral and the opportunity to participate in the care of this patient. A copy of today's note is routed to referring provider   Earlie Server, MD, PhD 09/10/2021

## 2021-09-10 NOTE — Progress Notes (Signed)
Pt here for follow up.

## 2021-09-13 LAB — CULTURE, URINE COMPREHENSIVE

## 2021-09-14 ENCOUNTER — Telehealth: Payer: Self-pay

## 2021-09-14 DIAGNOSIS — B9620 Unspecified Escherichia coli [E. coli] as the cause of diseases classified elsewhere: Secondary | ICD-10-CM

## 2021-09-14 MED ORDER — SULFAMETHOXAZOLE-TRIMETHOPRIM 800-160 MG PO TABS: 1.0000 | ORAL_TABLET | Freq: Two times a day (BID) | ORAL | 0 refills | Status: AC

## 2021-09-14 NOTE — Telephone Encounter (Signed)
Called pt's wife per DPR. Informed her of the information below. Wife gave verbal understanding. RX sent in.

## 2021-09-14 NOTE — Telephone Encounter (Signed)
-----   Message from Billey Co, MD sent at 09/14/2021  7:48 AM EST ----- Urine culture did ultimately infection, recommend Bactrim DS twice daily x7 days.  This may help with the urgency, frequency, and leakage  Nickolas Madrid, MD 09/14/2021

## 2021-09-15 ENCOUNTER — Other Ambulatory Visit: Payer: Self-pay

## 2021-09-15 ENCOUNTER — Ambulatory Visit (INDEPENDENT_AMBULATORY_CARE_PROVIDER_SITE_OTHER): Payer: Medicare Other | Admitting: Podiatry

## 2021-09-15 DIAGNOSIS — M79675 Pain in left toe(s): Secondary | ICD-10-CM

## 2021-09-15 DIAGNOSIS — B351 Tinea unguium: Secondary | ICD-10-CM | POA: Diagnosis not present

## 2021-09-15 DIAGNOSIS — M79674 Pain in right toe(s): Secondary | ICD-10-CM | POA: Diagnosis not present

## 2021-09-15 DIAGNOSIS — L989 Disorder of the skin and subcutaneous tissue, unspecified: Secondary | ICD-10-CM

## 2021-09-15 NOTE — Progress Notes (Signed)
   SUBJECTIVE Patient presents to office today complaining of elongated, thickened nails that cause pain while ambulating in shoes.  Patient is unable to trim their own nails. Patient is here for further evaluation and treatment.  Past Medical History:  Diagnosis Date   Alzheimer disease ([place])    GERD (gastroesophageal reflux disease)    Hypertension    Urinary incontinence     OBJECTIVE General Patient is awake, alert, and oriented x 3 and in no acute distress. Derm Skin is dry and supple bilateral. Negative open lesions or macerations. Remaining integument unremarkable. Nails are tender, long, thickened and dystrophic with subungual debris, consistent with onychomycosis, 1-5 bilateral. No signs of infection noted.  Hyperkeratotic preulcerative callus lesion right foot Vasc  DP and PT pedal pulses palpable bilaterally. Temperature gradient within normal limits.  Neuro Epicritic and protective threshold sensation grossly intact bilaterally.  Musculoskeletal Exam No symptomatic pedal deformities noted bilateral. Muscular strength within normal limits.  ASSESSMENT 1.  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  Preulcerative callus right foot  PLAN OF CARE 1. Patient evaluated today.  2. Instructed to maintain good pedal hygiene and foot care.  3. Mechanical debridement of nails 1-5 bilaterally performed using a nail nipper. Filed with dremel without incident.  4.  Excisional debridement of the hyperkeratotic preulcerative callus lesion was also performed to the right foot  5.  Return to clinic in 3 mos.    Edrick Kins, DPM [facility]  Dr. Edrick Kins, DPM    [address], [place] [address]                Office [phone]  Fax [phone]

## 2021-10-08 ENCOUNTER — Other Ambulatory Visit: Payer: Self-pay

## 2021-10-08 ENCOUNTER — Ambulatory Visit (INDEPENDENT_AMBULATORY_CARE_PROVIDER_SITE_OTHER): Payer: Medicare Other | Admitting: Urology

## 2021-10-08 DIAGNOSIS — N3281 Overactive bladder: Secondary | ICD-10-CM | POA: Diagnosis not present

## 2021-10-08 DIAGNOSIS — N390 Urinary tract infection, site not specified: Secondary | ICD-10-CM | POA: Diagnosis not present

## 2021-10-08 NOTE — Progress Notes (Signed)
Virtual Visit via Telephone Note  I connected with Robert Zamora and his wife on 10/08/21 at  1:00 PM EST by telephone and verified that I am speaking with the correct person using two identifiers.   Patient location: Home Provider location: [facility] Urologic Office   I discussed the limitations, risks, security and privacy concerns of performing an evaluation and management service by telephone and the availability of in person appointments. We discussed the impact of the COVID-19 pandemic on the healthcare system, and the importance of social distancing and reducing patient and provider exposure. I also discussed with the patient that there may be a patient responsible charge related to this service. The patient expressed understanding and agreed to proceed.  Reason for visit: UTI, overactive bladder  History of Present Illness: Comorbid 80 year old male with Alzheimer's dementia and multiple years of urgency, frequency, and incontinence requiring depends during the day and night.  PVR was normal at 50 mL.  PSA was normal, urine culture ultimately grew small amount of E. coli and he was treated with 2 weeks of Bactrim, as well as Myrbetriq for possible OAB symptoms.  I had a conversation with the patient and his wife today via speaker phone, it sounds like he had some improvement after the Bactrim with decreased odor of the urine and some improved urgency and frequency.  It sounds like his symptoms have significantly improved per his report.  They would like to stop the Myrbetriq to see if this was just a UTI, and see how his symptoms respond.  If he has worsening urinary symptoms after stopping the Myrbetriq, okay to resume at 50 mg daily   Follow Up: RTC 6 months symptom check, would avoid anticholinergics with his frailty and dementia   I discussed the assessment and treatment plan with the patient. The patient was provided an opportunity to ask questions and all were answered. The  patient agreed with the plan and demonstrated an understanding of the instructions.   The patient was advised to call back or seek an in-person evaluation if the symptoms worsen or if the condition fails to improve as anticipated.  I provided 8 minutes of non-face-to-face time during this encounter.   Billey Co, MD

## 2021-11-04 ENCOUNTER — Encounter: Payer: Self-pay | Admitting: Urology

## 2021-12-14 ENCOUNTER — Telehealth: Payer: Self-pay | Admitting: *Deleted

## 2021-12-14 NOTE — Telephone Encounter (Signed)
"  I'm calling on behalf of my husband, Robert Zamora.  He has an appointment on 12/24/2021.  However, I'm noticing that his ankles are getting darker and his feet remain swollen even after he elevates them.  I'm wondering if there's a way we can come in sooner or if there's a need for him to come in sooner than 02/16.  He is not Diabetic."

## 2021-12-15 NOTE — Telephone Encounter (Addendum)
"  I'm returning your call.  Dr. Amalia Hailey cannot see him today but we can get him in for an appointment in [place] today or here in [facility] tomorrow.  "I can take him to [facility] but it has to fit my schedule.  It will have to be after 4 pm."  We don't have anything that late.  "Well, what do you have tomorrow?"  I can schedule him an appointment tomorrow with Dr. Sherryle Lis at 3 pm.  "Okay, that will be fine."

## 2021-12-15 NOTE — Telephone Encounter (Signed)
Just get him in as soon as possible with whoever has first available. - Dr. Amalia Hailey

## 2021-12-16 ENCOUNTER — Other Ambulatory Visit: Payer: Self-pay

## 2021-12-16 ENCOUNTER — Ambulatory Visit (INDEPENDENT_AMBULATORY_CARE_PROVIDER_SITE_OTHER): Payer: Medicare Other | Admitting: Podiatry

## 2021-12-16 DIAGNOSIS — I872 Venous insufficiency (chronic) (peripheral): Secondary | ICD-10-CM

## 2021-12-16 NOTE — Patient Instructions (Signed)
Call : [phone] to schedule your testing and for consultation with the vascular doctor

## 2021-12-21 ENCOUNTER — Encounter: Payer: Self-pay | Admitting: Podiatry

## 2021-12-21 NOTE — Progress Notes (Signed)
°  Subjective:  Patient ID: Robert Zamora, male    DOB: [birth year],  MRN: [id number]  Chief Complaint  Patient presents with   Foot Swelling       HIS ANKLES ARE GETTING DARKER AND HIS FEET ARE SWOLLEN - [facility]   Nail Problem    Thick painful toenails    81 y.o. male presents with the above complaint. History confirmed with patient.  Here with his wife today.  They are concerned about the amount of swelling and dark discoloration around his legs.  Objective:  Physical Exam: warm, good capillary refill, no trophic changes or ulcerative lesions, normal sensory exam, and +2 DP pulse, unable to palpate PT pulse, significant venous insufficiency with hemosiderosis of the lower extremities Assessment:   1. 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  Reviewed my clinical findings the patient and his wife and discussed that this is likely a circulatory issue.  It is difficult to palpate his PT pulse and he has a significant mount of hemosiderosis.  There is no evidence of gangrene or ulceration.  I ordered noninvasive vascular testing and a referral to [facility] to follow-up on this further.  No follow-ups on file.

## 2021-12-22 ENCOUNTER — Encounter: Payer: Self-pay | Admitting: Urology

## 2021-12-22 ENCOUNTER — Ambulatory Visit: Payer: Medicare Other | Admitting: Urology

## 2021-12-22 ENCOUNTER — Other Ambulatory Visit
Admission: RE | Admit: 2021-12-22 | Discharge: 2021-12-22 | Disposition: A | Discharge status: Home / Self Care | Payer: Medicare Other | Attending: Urology | Admitting: Urology

## 2021-12-22 ENCOUNTER — Other Ambulatory Visit: Payer: Self-pay

## 2021-12-22 ENCOUNTER — Ambulatory Visit (INDEPENDENT_AMBULATORY_CARE_PROVIDER_SITE_OTHER): Payer: Medicare Other | Admitting: Urology

## 2021-12-22 ENCOUNTER — Other Ambulatory Visit: Payer: Self-pay | Admitting: *Deleted

## 2021-12-22 VITALS — BP 165/88 | HR 67 | Ht 75.0 in | Wt 233.0 lb

## 2021-12-22 DIAGNOSIS — N390 Urinary tract infection, site not specified: Secondary | ICD-10-CM | POA: Insufficient documentation

## 2021-12-22 DIAGNOSIS — N3281 Overactive bladder: Secondary | ICD-10-CM | POA: Diagnosis not present

## 2021-12-22 LAB — URINALYSIS, COMPLETE (UACMP) WITH MICROSCOPIC
Bacteria, UA: NONE SEEN
Bilirubin Urine: NEGATIVE
Glucose, UA: NEGATIVE mg/dL
Hgb urine dipstick: NEGATIVE
Ketones, ur: NEGATIVE mg/dL
Leukocytes,Ua: NEGATIVE
Nitrite: NEGATIVE
Protein, ur: 100 mg/dL — AB
Specific Gravity, Urine: 1.015 (ref 1.005–1.030)
pH: 7 (ref 5.0–8.0)

## 2021-12-22 LAB — BLADDER SCAN AMB NON-IMAGING

## 2021-12-22 NOTE — Patient Instructions (Signed)

## 2021-12-22 NOTE — Progress Notes (Signed)
° °  12/22/2021 2:32 PM   Friend Owens Shark [birth year] [id number]  Reason for visit: Follow up OAB, UTI  HPI: 80 year old comorbid male with Alzheimer's dementia and multiple years of urgency, frequency, and incontinence requiring depends.  At our original visit in November 2022 PVR and PSA were normal, but urine culture ultimately grew a small amount of E. coli and he was treated with 2 weeks of Bactrim as well as Myrbetriq.  It sounds like he had had some improvement after the Bactrim and they wanted to stop the Myrbetriq to see if his symptoms were from just a UTI.  He has had worsening urinary symptoms over the last few months with urgency, frequency, urge incontinence, and incontinence overnight.  He denies any dysuria or gross hematuria.  Urinalysis today is completely benign.  PVR essentially normal at 118mL.  We discussed that overactive bladder (OAB) is not a disease, but is a symptom complex that is generally not life-threatening.  Symptoms typically include urinary urgency, frequency, and urge incontinence.  There are numerous treatment options, however there are risks and benefits with both medical and surgical management.  First-line treatment is behavioral therapies including bladder training, pelvic floor muscle training, and fluid management.  Second line treatments include oral antimuscarinics(Ditropan er, Trospium) and beta-3 agonist (Mybetriq). There is typically a period of medication trial (4-8 weeks) to find the optimal therapy and dosing. If symptoms are bothersome despite the above management, third line options include intra-detrusor botox, peripheral tibial nerve stimulation (PTNS), and interstim (SNS). These are more invasive treatments with higher side effect profile, but may improve quality of life for patients with severe OAB symptoms.   -Trial of Myrbetriq 50 mg daily x4 weeks, samples given -Virtual visit 4 weeks symptom check(consider Gemtesa samples at that time if  persistent symptoms) -Consider cystoscopy in the future if refractory symptoms   Billey Co, [place] [address], [place], [place] [address] [phone]

## 2021-12-23 ENCOUNTER — Ambulatory Visit: Payer: Medicare Other | Admitting: Urology

## 2021-12-24 ENCOUNTER — Encounter (INDEPENDENT_AMBULATORY_CARE_PROVIDER_SITE_OTHER): Payer: Self-pay | Admitting: Vascular Surgery

## 2021-12-24 ENCOUNTER — Ambulatory Visit (INDEPENDENT_AMBULATORY_CARE_PROVIDER_SITE_OTHER): Payer: Medicare Other | Admitting: Vascular Surgery

## 2021-12-24 ENCOUNTER — Other Ambulatory Visit (INDEPENDENT_AMBULATORY_CARE_PROVIDER_SITE_OTHER): Payer: Self-pay | Admitting: Vascular Surgery

## 2021-12-24 ENCOUNTER — Ambulatory Visit (INDEPENDENT_AMBULATORY_CARE_PROVIDER_SITE_OTHER): Payer: Medicare Other

## 2021-12-24 ENCOUNTER — Other Ambulatory Visit: Payer: Self-pay

## 2021-12-24 ENCOUNTER — Ambulatory Visit: Payer: Medicare Other | Admitting: Podiatry

## 2021-12-24 ENCOUNTER — Ambulatory Visit (INDEPENDENT_AMBULATORY_CARE_PROVIDER_SITE_OTHER): Payer: Medicare Other | Admitting: Podiatry

## 2021-12-24 ENCOUNTER — Encounter: Payer: Self-pay | Admitting: Podiatry

## 2021-12-24 VITALS — BP 166/89 | HR 59 | Resp 16 | Ht 75.0 in | Wt 232.8 lb

## 2021-12-24 DIAGNOSIS — M79604 Pain in right leg: Secondary | ICD-10-CM

## 2021-12-24 DIAGNOSIS — M79675 Pain in left toe(s): Secondary | ICD-10-CM | POA: Diagnosis not present

## 2021-12-24 DIAGNOSIS — I872 Venous insufficiency (chronic) (peripheral): Secondary | ICD-10-CM | POA: Diagnosis not present

## 2021-12-24 DIAGNOSIS — F32A Depression, unspecified: Secondary | ICD-10-CM | POA: Insufficient documentation

## 2021-12-24 DIAGNOSIS — L819 Disorder of pigmentation, unspecified: Secondary | ICD-10-CM

## 2021-12-24 DIAGNOSIS — K219 Gastro-esophageal reflux disease without esophagitis: Secondary | ICD-10-CM

## 2021-12-24 DIAGNOSIS — B351 Tinea unguium: Secondary | ICD-10-CM

## 2021-12-24 DIAGNOSIS — I739 Peripheral vascular disease, unspecified: Secondary | ICD-10-CM

## 2021-12-24 DIAGNOSIS — L989 Disorder of the skin and subcutaneous tissue, unspecified: Secondary | ICD-10-CM

## 2021-12-24 DIAGNOSIS — I890 Lymphedema, not elsewhere classified: Secondary | ICD-10-CM

## 2021-12-24 DIAGNOSIS — M7989 Other specified soft tissue disorders: Secondary | ICD-10-CM

## 2021-12-24 DIAGNOSIS — M79674 Pain in right toe(s): Secondary | ICD-10-CM

## 2021-12-24 DIAGNOSIS — F0280 Dementia in other diseases classified elsewhere without behavioral disturbance: Secondary | ICD-10-CM | POA: Insufficient documentation

## 2021-12-24 DIAGNOSIS — G9389 Other specified disorders of brain: Secondary | ICD-10-CM | POA: Insufficient documentation

## 2021-12-24 DIAGNOSIS — M79605 Pain in left leg: Secondary | ICD-10-CM | POA: Diagnosis not present

## 2021-12-24 DIAGNOSIS — I10 Essential (primary) hypertension: Secondary | ICD-10-CM | POA: Diagnosis not present

## 2021-12-24 DIAGNOSIS — F0150 Vascular dementia without behavioral disturbance: Secondary | ICD-10-CM | POA: Insufficient documentation

## 2021-12-24 NOTE — Progress Notes (Signed)
MRN : [id number]  Robert Zamora is a 81 y.o. (Dec 29, 1940) male who presents with chief complaint of check feet.  History of Present Illness:   Patient is seen for evaluation of leg swelling. The patient first noticed the swelling remotely but is now concerned because of a significant increase in the overall edema. The swelling is associated with pain and discoloration. The patient notes that in the morning the legs are significantly improved but they steadily worsened throughout the course of the day. Elevation makes the legs better, dependency makes them much worse.   There is no history of ulcerations associated with the swelling.   The patient denies any recent changes in their medications.  The patient has not been wearing graduated compression.  The patient has no had any past angiography, interventions or vascular surgery.  The patient denies a history of DVT or PE. There is no prior history of phlebitis. There is no history of primary lymphedema.  There is no history of radiation treatment to the groin or pelvis No history of malignancies. No history of trauma or groin or pelvic surgery. No history of foreign travel or parasitic infections area.  Venous duplex shows bilateral deep reflux but no superficial reflux noted.  No acute or chronic DVT identified.  ABI done here on 04/03/2021  Rt=1.11 (triphasic signals) and Lt=1.18 (triphasic signals)  No outpatient medications have been marked as taking for the 12/24/21 encounter (Appointment) with Delana Meyer, Dolores Lory, MD.    Past Medical History:  Diagnosis Date   Alzheimer disease ([place])    GERD (gastroesophageal reflux disease)    Hypertension    Urinary incontinence     Past Surgical History:  Procedure Laterality Date   CIRCUMCISION      Social History Social History   Tobacco Use   Smoking status: Former   Smokeless tobacco: Never   Tobacco comments:    Quit 1980's   Substance Use Topics   Alcohol use: Not  Currently   Drug use: Never    Family History Family History  Problem Relation Age of Onset   Cancer Neg Hx     No Known Allergies   REVIEW OF SYSTEMS (Negative unless checked)  Constitutional: [] Weight loss  [] Fever  [] Chills Cardiac: [] Chest pain   [] Chest pressure   [] Palpitations   [] Shortness of breath when laying flat   [] Shortness of breath with exertion. Vascular:  [] Pain in legs with walking   [x] Pain in legs at rest  [] History of DVT   [] Phlebitis   [x] Swelling in legs   [] Varicose veins   [] Non-healing ulcers Pulmonary:   [] Uses home oxygen   [] Productive cough   [] Hemoptysis   [] Wheeze  [] COPD   [] Asthma Neurologic:  [] Dizziness   [] Seizures   [] History of stroke   [] History of TIA  [] Aphasia   [] Vissual changes   [] Weakness or numbness in arm   [] Weakness or numbness in leg Musculoskeletal:   [] Joint swelling   [] Joint pain   [] Low back pain Hematologic:  [] Easy bruising  [] Easy bleeding   [] Hypercoagulable state   [] Anemic Gastrointestinal:  [] Diarrhea   [] Vomiting  [x] Gastroesophageal reflux/heartburn   [] Difficulty swallowing. Genitourinary:  [] Chronic kidney disease   [] Difficult urination  [] Frequent urination   [] Blood in urine Skin:  [x] Rashes   [] Ulcers  Psychological:  [] History of anxiety   []  History of major depression.  Physical Examination  There were no vitals filed for this visit. There is no height or weight on file  to calculate BMI. Gen: WD/WN, NAD Head: [place]/AT, No temporalis wasting.  Ear/Nose/Throat: Hearing grossly intact, nares w/o erythema or drainage, pinna without lesions Eyes: PER, EOMI, sclera nonicteric.  Neck: Supple, no gross masses.  No JVD.  Pulmonary:  Good air movement, no audible wheezing, no use of accessory muscles.  Cardiac: RRR, precordium not hyperdynamic. Vascular:  scattered varicosities present bilaterally.  Moderate venous stasis changes to the legs bilaterally. 3-4+ soft pitting edema  Vessel Right Left  Radial Palpable  Palpable  Gastrointestinal: soft, non-distended. No guarding/no peritoneal signs.  Musculoskeletal: M/S 5/5 throughout.  No deformity.  Neurologic: CN 2-12 intact. Pain and light touch intact in extremities.  Symmetrical.  Speech is fluent. Motor exam as listed above. Psychiatric: Judgment intact, Mood & affect appropriate for pt's clinical situation. Dermatologic: Moderate venous rashes no ulcers noted.  No changes consistent with cellulitis. Lymph : No lichenification or skin changes of chronic lymphedema.  CBC Lab Results  Component Value Date   WBC 5.5 08/19/2021   HGB 13.5 08/19/2021   HCT 40.0 08/19/2021   MCV 83.3 08/19/2021   PLT 251 08/19/2021    BMET    Component Value Date/Time   NA 137 08/10/2021 1802   K 4.2 08/10/2021 1802   CL 107 08/10/2021 1802   CO2 24 08/10/2021 1802   GLUCOSE 107 (H) 08/10/2021 1802   BUN 16 08/10/2021 1802   CREATININE 1.86 (H) 08/10/2021 1802   CALCIUM 8.5 (L) 08/10/2021 1802   GFRNONAA 36 (L) 08/10/2021 1802   CrCl cannot be calculated (Patient's most recent lab result is older than the maximum 21 days allowed.).  COAG Lab Results  Component Value Date   INR 1.1 08/10/2021    Radiology No results found.   Assessment/Plan 1. Chronic venous insufficiency I have had a long discussion with the patient regarding swelling and why it  causes symptoms.  Patient will begin wearing graduated compression stockings class 1 (20-30 mmHg) on a daily basis a prescription was given. The patient will  beginning wearing the stockings first thing in the morning and removing them in the evening. The patient is instructed specifically not to sleep in the stockings.   In addition, behavioral modification will be initiated.  This will include frequent elevation, use of over the counter pain medications and exercise such as walking.  I have reviewed systemic causes for chronic edema such as liver, kidney and cardiac etiologies.  The patient denies  problems with these organ systems.    Consideration for a lymph pump will also be made based upon the effectiveness of conservative therapy.  This would help to improve the edema control and prevent sequela such as ulcers and infections   Venous duplex shows bilateral deep reflux but no superficial reflux noted.  No acute or chronic DVT identified.  The patient will follow-up with me after the ultrasound.    A total of 50 minutes was spent with this patient and greater than 50% was spent in counseling and coordination of care with the patient.  Discussion included the treatment options for vascular disease including indications for surgery and intervention.  Also discussed is the appropriate timing of treatment.  In addition medical therapy was discussed.   - VAS [place] LOWER EXTREMITY VENOUS REFLUX; Future  2. Lymphedema  have had a long discussion with the patient regarding swelling and why it  causes symptoms.  Patient will begin wearing graduated compression stockings class 1 (20-30 mmHg) on a daily basis a prescription was  given. The patient will  beginning wearing the stockings first thing in the morning and removing them in the evening. The patient is instructed specifically not to sleep in the stockings.   In addition, behavioral modification will be initiated.  This will include frequent elevation, use of over the counter pain medications and exercise such as walking.  I have reviewed systemic causes for chronic edema such as liver, kidney and cardiac etiologies.  The patient denies problems with these organ systems.    Consideration for a lymph pump will also be made based upon the effectiveness of conservative therapy.  This would help to improve the edema control and prevent sequela such as ulcers and infections   Venous duplex shows bilateral deep reflux but no superficial reflux noted.  No acute or chronic DVT identified.  The patient will follow-up with me after the ultrasound.     A total of 50 minutes was spent with this patient and greater than 50% was spent in counseling and coordination of care with the patient.  Discussion included the treatment options for vascular disease including indications for surgery and intervention.  Also discussed is the appropriate timing of treatment.  In addition medical therapy was discussed.    3. PAD (peripheral artery disease) ([place]) Recommend:  I do not find evidence of life style limiting vascular disease. The patient specifically denies life style limitation.  Previous noninvasive studies including ABI's of the legs do not identify critical vascular problems.  The patient should continue walking and begin a more formal exercise program. The patient should continue his antiplatelet therapy and aggressive treatment of the lipid abnormalities.   4. HTN (hypertension), benign Continue antihypertensive medications as already ordered, these medications have been reviewed and there are no changes at this time.   5. Gastroesophageal reflux disease, unspecified whether esophagitis present Continue PPI as already ordered, this medication has been reviewed and there are no changes at this time.  Avoidence of caffeine and alcohol  Moderate elevation of the head of the bed      Hortencia Pilar, MD  12/24/2021 9:23 AM

## 2021-12-24 NOTE — Progress Notes (Signed)
This patient returns to my office for at risk foot care.  This patient requires this care by a professional since this patient will be at risk due to having venous insufficiency and PAD.  This patient is unable to cut nails himself since the patient cannot reach his nails.These nails are painful walking and wearing shoes.  This patient presents for at risk foot care today.  General Appearance  Alert, conversant and in no acute stress.  Vascular  Dorsalis pedis and posterior tibial  pulses are weakly palpable  bilaterally.  Capillary return is within normal limits  bilaterally. Temperature is within normal limits  bilaterally.  Neurologic  Senn-Weinstein monofilament wire test within normal limits  bilaterally. Muscle power within normal limits bilaterally.  Nails Thick disfigured discolored nails with subungual debris  from hallux to fifth toes bilaterally. No evidence of bacterial infection or drainage bilaterally.  Orthopedic  No limitations of motion  feet .  No crepitus or effusions noted.  No bony pathology or digital deformities noted. HAV  B/L.  Skin  normotropic skin with no porokeratosis noted bilaterally.  No signs of infections or ulcers noted.  Healed ulcer under IPJ right foot.   Onychomycosis  Pain in right toes  Pain in left toes  Consent was obtained for treatment procedures.   Mechanical debridement of nails 1-5  bilaterally performed with a nail nipper.  Filed with dremel without incident.    Return office visit   3 months                  Told patient to return for periodic foot care and evaluation due to potential at risk complications.   Gardiner Barefoot DPM

## 2021-12-26 ENCOUNTER — Encounter (INDEPENDENT_AMBULATORY_CARE_PROVIDER_SITE_OTHER): Payer: Self-pay | Admitting: Vascular Surgery

## 2021-12-26 DIAGNOSIS — I890 Lymphedema, not elsewhere classified: Secondary | ICD-10-CM | POA: Insufficient documentation

## 2021-12-28 ENCOUNTER — Other Ambulatory Visit: Payer: Self-pay

## 2021-12-28 ENCOUNTER — Ambulatory Visit (INDEPENDENT_AMBULATORY_CARE_PROVIDER_SITE_OTHER): Payer: Medicare Other | Admitting: Vascular Surgery

## 2021-12-28 ENCOUNTER — Encounter (INDEPENDENT_AMBULATORY_CARE_PROVIDER_SITE_OTHER): Payer: Self-pay | Admitting: Vascular Surgery

## 2021-12-28 VITALS — BP 162/80 | HR 61 | Ht 75.0 in | Wt 229.0 lb

## 2021-12-28 DIAGNOSIS — I10 Essential (primary) hypertension: Secondary | ICD-10-CM | POA: Diagnosis not present

## 2021-12-28 DIAGNOSIS — I872 Venous insufficiency (chronic) (peripheral): Secondary | ICD-10-CM

## 2021-12-28 DIAGNOSIS — K219 Gastro-esophageal reflux disease without esophagitis: Secondary | ICD-10-CM

## 2021-12-28 DIAGNOSIS — I739 Peripheral vascular disease, unspecified: Secondary | ICD-10-CM

## 2021-12-28 DIAGNOSIS — I890 Lymphedema, not elsewhere classified: Secondary | ICD-10-CM | POA: Diagnosis not present

## 2022-01-03 ENCOUNTER — Encounter (INDEPENDENT_AMBULATORY_CARE_PROVIDER_SITE_OTHER): Payer: Self-pay | Admitting: Vascular Surgery

## 2022-01-03 NOTE — Progress Notes (Signed)
MRN : [id number]  Robert Zamora is a 81 y.o. ([birth year]) male who presents with chief complaint of leg swelling.  History of Present Illness: The patient returns to the office for followup evaluation regarding leg swelling.  The swelling has persisted and the pain associated with swelling continues. There have not been any interval development of a ulcerations or wounds.  Since the previous visit the patient has been wearing graduated compression stockings and has noted little if any improvement in the lymphedema. The patient has been using compression routinely morning until night.  The patient also states elevation during the day and exercise is being done too.   Current Meds  Medication Sig   aspirin 81 MG EC tablet Take by mouth.   buPROPion (WELLBUTRIN XL) 300 MG 24 hr tablet Take by mouth.   Cholecalciferol 25 MCG (1000 UT) capsule Take by mouth.   Cranberry 400 MG CAPS Take by mouth.   donepezil (ARICEPT) 10 MG tablet Take 1 tablet by mouth daily.   memantine (NAMENDA) 10 MG tablet Take 1 tablet by mouth 2 (two) times daily.   Mirabegron (MYRBETRIQ PO) Take by mouth.   olmesartan (BENICAR) 40 MG tablet Take by mouth.   Turmeric 500 MG CAPS Take by mouth. Turmeric/Ginger tablet 1 QD    Past Medical History:  Diagnosis Date   Alzheimer disease ([place])    GERD (gastroesophageal reflux disease)    Hypertension    Urinary incontinence     Past Surgical History:  Procedure Laterality Date   CIRCUMCISION      Social History Social History   Tobacco Use   Smoking status: Former   Smokeless tobacco: Never   Tobacco comments:    Quit 1980's   Substance Use Topics   Alcohol use: Not Currently   Drug use: Never    Family History Family History  Problem Relation Age of Onset   Stroke Mother    Stroke Father    Cancer Neg Hx     No Known Allergies   REVIEW OF SYSTEMS (Negative unless checked)  Constitutional: [] Weight loss  [] Fever  [] Chills Cardiac: [] Chest  pain   [] Chest pressure   [] Palpitations   [] Shortness of breath when laying flat   [] Shortness of breath with exertion. Vascular:  [] Pain in legs with walking   [x] Pain in legs at rest  [] History of DVT   [] Phlebitis   [x] Swelling in legs   [] Varicose veins   [] Non-healing ulcers Pulmonary:   [] Uses home oxygen   [] Productive cough   [] Hemoptysis   [] Wheeze  [] COPD   [] Asthma Neurologic:  [] Dizziness   [] Seizures   [] History of stroke   [] History of TIA  [] Aphasia   [] Vissual changes   [] Weakness or numbness in arm   [] Weakness or numbness in leg Musculoskeletal:   [] Joint swelling   [] Joint pain   [] Low back pain Hematologic:  [] Easy bruising  [] Easy bleeding   [] Hypercoagulable state   [] Anemic Gastrointestinal:  [] Diarrhea   [] Vomiting  [x] Gastroesophageal reflux/heartburn   [] Difficulty swallowing. Genitourinary:  [] Chronic kidney disease   [] Difficult urination  [] Frequent urination   [] Blood in urine Skin:  [] Rashes   [] Ulcers  Psychological:  [] History of anxiety   []  History of major depression.  Physical Examination  Vitals:   12/28/21 1325  BP: (!) 162/80  Pulse: 61  Weight: 229 lb (103.9 kg)  Height: 6\' 3"  (1.905 m)   Body mass index is 28.62 kg/m. Gen: WD/WN, NAD Head: [place]/AT, No  temporalis wasting.  Ear/Nose/Throat: Hearing grossly intact, nares w/o erythema or drainage, pinna without lesions Eyes: PER, EOMI, sclera nonicteric.  Neck: Supple, no gross masses.  No JVD.  Pulmonary:  Good air movement, no audible wheezing, no use of accessory muscles.  Cardiac: RRR, precordium not hyperdynamic. Vascular:  scattered varicosities present bilaterally.  Moderate venous stasis changes to the legs bilaterally.  3+ soft pitting edema  Vessel Right Left  Radial Palpable Palpable  Gastrointestinal: soft, non-distended. No guarding/no peritoneal signs.  Musculoskeletal: M/S 5/5 throughout.  No deformity.  Neurologic: CN 2-12 intact. Pain and light touch intact in extremities.   Symmetrical.  Speech is fluent. Motor exam as listed above. Psychiatric: Judgment intact, Mood & affect appropriate for pt's clinical situation. Dermatologic: Venous rashes no ulcers noted.  No changes consistent with cellulitis. Lymph : No lichenification or skin changes of chronic lymphedema.  CBC Lab Results  Component Value Date   WBC 5.5 08/19/2021   HGB 13.5 08/19/2021   HCT 40.0 08/19/2021   MCV 83.3 08/19/2021   PLT 251 08/19/2021    BMET    Component Value Date/Time   NA 137 08/10/2021 1802   K 4.2 08/10/2021 1802   CL 107 08/10/2021 1802   CO2 24 08/10/2021 1802   GLUCOSE 107 (H) 08/10/2021 1802   BUN 16 08/10/2021 1802   CREATININE 1.86 (H) 08/10/2021 1802   CALCIUM 8.5 (L) 08/10/2021 1802   GFRNONAA 36 (L) 08/10/2021 1802   CrCl cannot be calculated (Patient's most recent lab result is older than the maximum 21 days allowed.).  COAG Lab Results  Component Value Date   INR 1.1 08/10/2021    Radiology VAS [place] LOWER EXTREMITY VENOUS REFLUX  Result Date: 12/28/2021  Lower Venous Reflux Study Patient Name:  TAMARICK KOVALCIK  Date of Exam:   12/24/2021 Medical Rec #: [id number]     Accession #:    [id number] Date of Birth: 10-Jan-1941      Patient Gender: M Patient Age:   54 years Exam Location:  [facility] Procedure:      VAS [place] LOWER EXTREMITY VENOUS REFLUX Referring Phys: Hortencia Pilar --------------------------------------------------------------------------------  Indications: Pain, and Swelling.  Performing Technologist: Blondell Reveal RT, RDMS, RVT  Examination Guidelines: A complete evaluation includes B-mode imaging, spectral Doppler, color Doppler, and power Doppler as needed of all accessible portions of each vessel. Bilateral testing is considered an integral part of a complete examination. Limited examinations for reoccurring indications may be performed as noted. The reflux portion of the exam is performed with the patient in reverse Trendelenburg.  Significant venous reflux is defined as >500 ms in the superficial venous system, and >1 second in the deep venous system.  Venous Reflux Times +--------------+---------+------+-----------+------------+--------+  RIGHT          Reflux No Reflux Reflux Time Diameter cms Comments                             Yes                                      +--------------+---------+------+-----------+------------+--------+  CFV                       yes    >1 second                         +--------------+---------+------+-----------+------------+--------+  FV mid                    yes    >1 second                         +--------------+---------+------+-----------+------------+--------+  Popliteal      no                                                  +--------------+---------+------+-----------+------------+--------+  GSV at [facility]     no                                                  +--------------+---------+------+-----------+------------+--------+  GSV prox thigh no                                                  +--------------+---------+------+-----------+------------+--------+  GSV at knee    no                                                  +--------------+---------+------+-----------+------------+--------+  SSV prox calf  no                                                  +--------------+---------+------+-----------+------------+--------+  +--------------+---------+------+-----------+------------+--------+  LEFT           Reflux No Reflux Reflux Time Diameter cms Comments                             Yes                                      +--------------+---------+------+-----------+------------+--------+  CFV                       yes    >1 second                         +--------------+---------+------+-----------+------------+--------+  FV mid                    yes    >1 second                         +--------------+---------+------+-----------+------------+--------+  Popliteal                 yes    >1  second                         +--------------+---------+------+-----------+------------+--------+  GSV at [facility]     no                                                  +--------------+---------+------+-----------+------------+--------+  GSV prox thigh no                                                  +--------------+---------+------+-----------+------------+--------+  GSV at knee    no                                                  +--------------+---------+------+-----------+------------+--------+  SSV prox calf  no                                                  +--------------+---------+------+-----------+------------+--------+  Summary: Right: - No evidence of deep vein thrombosis seen in the right lower extremity, from the common femoral through the popliteal veins. - No evidence of superficial venous thrombosis in the right lower extremity. - No evidence of superficial venous reflux seen in the right greater saphenous vein. - No evidence of superficial venous reflux seen in the right short saphenous vein. - Venous reflux is noted in the right common femoral vein. - Venous reflux is noted in the right femoral vein.  Left: - No evidence of deep vein thrombosis seen in the left lower extremity, from the common femoral through the popliteal veins. - No evidence of superficial venous thrombosis in the left lower extremity. - No evidence of superficial venous reflux seen in the left greater saphenous vein. - No evidence of superficial venous reflux seen in the left short saphenous vein. - Venous reflux is noted in the left common femoral vein. - Venous reflux is noted in the left femoral vein. - Venous reflux is noted in the left popliteal vein.  *See table(s) above for measurements and observations. Electronically signed by Hortencia Pilar MD on 12/28/2021 at 2:33:16 PM.    Final      Assessment/Plan 1. Lymphedema I have had a long discussion with the patient regarding swelling and why it  causes symptoms.   Patient will begin wearing graduated compression stockings class 1 (20-30 mmHg) on a daily basis a prescription was given. The patient will  beginning wearing the stockings first thing in the morning and removing them in the evening. The patient is instructed specifically not to sleep in the stockings.   In addition, behavioral modification will be initiated.  This will include frequent elevation, use of over the counter pain medications and exercise such as walking.  I have reviewed systemic causes for chronic edema such as liver, kidney and cardiac etiologies.  The patient denies problems with these organ systems.    Consideration for a lymph pump will also be made based upon the effectiveness of conservative therapy.  This would help to improve the edema control and prevent sequela such as ulcers and infections   Patient should undergo duplex ultrasound of the venous system to ensure that DVT or reflux is not present.  The patient will follow-up with me after the ultrasound.    2. Chronic venous insufficiency I have had a long discussion with the patient regarding swelling and why it  causes symptoms.  Patient will begin wearing graduated compression stockings class 1 (20-30 mmHg)  on a daily basis a prescription was given. The patient will  beginning wearing the stockings first thing in the morning and removing them in the evening. The patient is instructed specifically not to sleep in the stockings.   In addition, behavioral modification will be initiated.  This will include frequent elevation, use of over the counter pain medications and exercise such as walking.  I have reviewed systemic causes for chronic edema such as liver, kidney and cardiac etiologies.  The patient denies problems with these organ systems.    Consideration for a lymph pump will also be made based upon the effectiveness of conservative therapy.  This would help to improve the edema control and prevent sequela such as  ulcers and infections   Patient should undergo duplex ultrasound of the venous system to ensure that DVT or reflux is not present.  The patient will follow-up with me after the ultrasound.    3. HTN (hypertension), benign Continue antihypertensive medications as already ordered, these medications have been reviewed and there are no changes at this time.   4. PAD (peripheral artery disease) ([place]) Recommend:  I do not find evidence of life style limiting vascular disease. The patient specifically denies life style limitation.  Previous noninvasive studies including ABI's of the legs do not identify critical vascular problems.  The patient should continue walking and begin a more formal exercise program. The patient should continue his antiplatelet therapy and aggressive treatment of the lipid abnormalities.   5. Gastroesophageal reflux disease, unspecified whether esophagitis present Continue PPI as already ordered, this medication has been reviewed and there are no changes at this time.  Avoidence of caffeine and alcohol  Moderate elevation of the head of the bed      Hortencia Pilar, MD  01/03/2022 4:58 PM

## 2022-01-13 ENCOUNTER — Observation Stay: Payer: Medicare Other

## 2022-01-13 ENCOUNTER — Observation Stay
Admission: EM | Admit: 2022-01-13 | Discharge: 2022-01-14 | Disposition: A | Discharge status: Home Health | Payer: Medicare Other | Attending: Internal Medicine | Admitting: Internal Medicine

## 2022-01-13 ENCOUNTER — Other Ambulatory Visit: Payer: Self-pay

## 2022-01-13 ENCOUNTER — Emergency Department: Payer: Medicare Other

## 2022-01-13 DIAGNOSIS — N1832 Chronic kidney disease, stage 3b: Secondary | ICD-10-CM | POA: Diagnosis not present

## 2022-01-13 DIAGNOSIS — Z87891 Personal history of nicotine dependence: Secondary | ICD-10-CM | POA: Insufficient documentation

## 2022-01-13 DIAGNOSIS — R531 Weakness: Secondary | ICD-10-CM | POA: Diagnosis present

## 2022-01-13 DIAGNOSIS — F0150 Vascular dementia without behavioral disturbance: Secondary | ICD-10-CM | POA: Diagnosis present

## 2022-01-13 DIAGNOSIS — Z7982 Long term (current) use of aspirin: Secondary | ICD-10-CM | POA: Diagnosis not present

## 2022-01-13 DIAGNOSIS — G459 Transient cerebral ischemic attack, unspecified: Secondary | ICD-10-CM | POA: Diagnosis not present

## 2022-01-13 DIAGNOSIS — R2681 Unsteadiness on feet: Secondary | ICD-10-CM | POA: Diagnosis not present

## 2022-01-13 DIAGNOSIS — F0280 Dementia in other diseases classified elsewhere without behavioral disturbance: Secondary | ICD-10-CM

## 2022-01-13 DIAGNOSIS — Z20822 Contact with and (suspected) exposure to covid-19: Secondary | ICD-10-CM | POA: Diagnosis not present

## 2022-01-13 DIAGNOSIS — Z79899 Other long term (current) drug therapy: Secondary | ICD-10-CM | POA: Insufficient documentation

## 2022-01-13 DIAGNOSIS — I129 Hypertensive chronic kidney disease with stage 1 through stage 4 chronic kidney disease, or unspecified chronic kidney disease: Secondary | ICD-10-CM | POA: Diagnosis not present

## 2022-01-13 DIAGNOSIS — G309 Alzheimer's disease, unspecified: Secondary | ICD-10-CM | POA: Diagnosis present

## 2022-01-13 LAB — DIFFERENTIAL
Abs Immature Granulocytes: 0.01 10*3/uL (ref 0.00–0.07)
Basophils Absolute: 0.1 10*3/uL (ref 0.0–0.1)
Basophils Relative: 1 %
Eosinophils Absolute: 0.2 10*3/uL (ref 0.0–0.5)
Eosinophils Relative: 3 %
Immature Granulocytes: 0 %
Lymphocytes Relative: 26 %
Lymphs Abs: 1.3 10*3/uL (ref 0.7–4.0)
Monocytes Absolute: 0.5 10*3/uL (ref 0.1–1.0)
Monocytes Relative: 9 %
Neutro Abs: 3.1 10*3/uL (ref 1.7–7.7)
Neutrophils Relative %: 61 %

## 2022-01-13 LAB — COMPREHENSIVE METABOLIC PANEL
ALT: 18 U/L (ref 0–44)
AST: 19 U/L (ref 15–41)
Albumin: 3.4 g/dL — ABNORMAL LOW (ref 3.5–5.0)
Alkaline Phosphatase: 62 U/L (ref 38–126)
Anion gap: 8 (ref 5–15)
BUN: 16 mg/dL (ref 8–23)
CO2: 24 mmol/L (ref 22–32)
Calcium: 8.7 mg/dL — ABNORMAL LOW (ref 8.9–10.3)
Chloride: 105 mmol/L (ref 98–111)
Creatinine, Ser: 1.68 mg/dL — ABNORMAL HIGH (ref 0.61–1.24)
GFR, Estimated: 40 mL/min — ABNORMAL LOW (ref >60)
Glucose, Bld: 95 mg/dL (ref 70–99)
Potassium: 4.4 mmol/L (ref 3.5–5.1)
Sodium: 137 mmol/L (ref 135–145)
Total Bilirubin: 0.7 mg/dL (ref 0.3–1.2)
Total Protein: 7.2 g/dL (ref 6.5–8.1)

## 2022-01-13 LAB — CBC
HCT: 40.6 % (ref 39.0–52.0)
Hemoglobin: 13.5 g/dL (ref 13.0–17.0)
MCH: 27.8 pg (ref 26.0–34.0)
MCHC: 33.3 g/dL (ref 30.0–36.0)
MCV: 83.7 fL (ref 80.0–100.0)
Platelets: 206 10*3/uL (ref 150–400)
RBC: 4.85 MIL/uL (ref 4.22–5.81)
RDW: 13.2 % (ref 11.5–15.5)
WBC: 5.1 10*3/uL (ref 4.0–10.5)
nRBC: 0 % (ref 0.0–0.2)

## 2022-01-13 LAB — TROPONIN I (HIGH SENSITIVITY)
Troponin I (High Sensitivity): 20 ng/L — ABNORMAL HIGH (ref <18)
Troponin I (High Sensitivity): 32 ng/L — ABNORMAL HIGH (ref <18)

## 2022-01-13 LAB — CBG MONITORING, ED: Glucose-Capillary: 88 mg/dL (ref 70–99)

## 2022-01-13 LAB — RESP PANEL BY RT-PCR (FLU A&B, COVID) ARPGX2
Influenza A by PCR: NEGATIVE
Influenza B by PCR: NEGATIVE
SARS Coronavirus 2 by RT PCR: NEGATIVE

## 2022-01-13 LAB — APTT: aPTT: 29 seconds (ref 24–36)

## 2022-01-13 LAB — PROTIME-INR
INR: 1 (ref 0.8–1.2)
Prothrombin Time: 13.6 seconds (ref 11.4–15.2)

## 2022-01-13 IMAGING — MR MR HEAD W/O CM
11 series · 43 of 48 positions shown · non-contrast
Comparison: Prior CT and CTA from earlier the same day.

CLINICAL DATA: Initial evaluation for neuro deficit, stroke
suspected.

EXAM:
MRI HEAD WITHOUT CONTRAST
TECHNIQUE: Multiplanar, multiecho pulse sequences of the brain and surrounding
structures were obtained without intravenous contrast.

[Series 5: ax dwi_tracew · axial · 3.0mm · 0.65mm/px · z∈[-100,+54]mm · 5 of 48 slices shown]
[im 1/48]
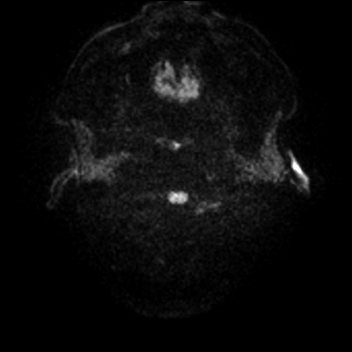
[im 12/48]
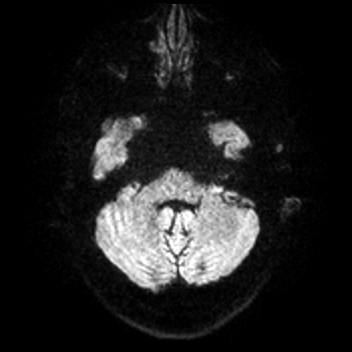
[im 24/48]
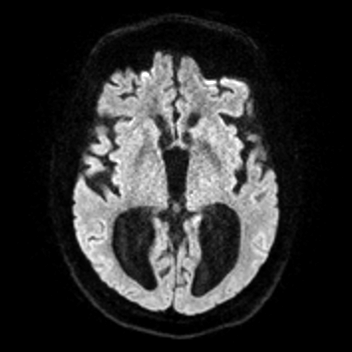
[im 36/48]
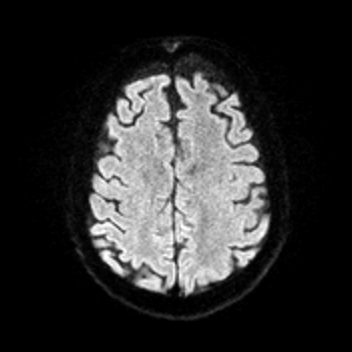
[im 48/48]
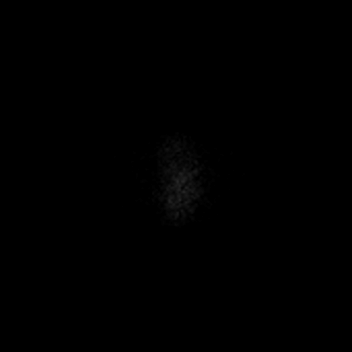

[Series 6: ax dwi_adc · axial · 3.0mm · 0.65mm/px · z∈[-100,+54]mm · 4 of 48 slices shown]
[im 1/48]
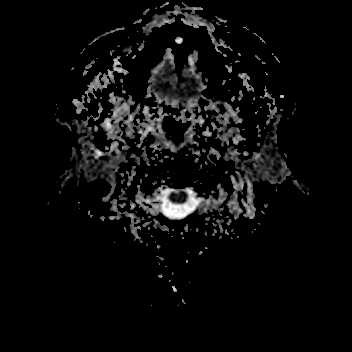
[im 16/48]
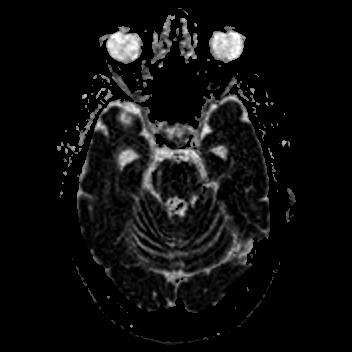
[im 32/48]
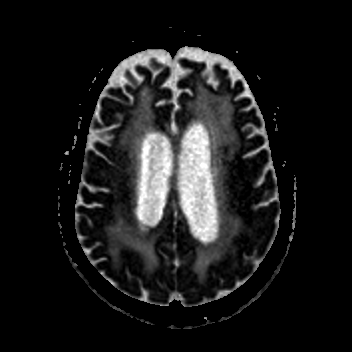
[im 48/48]
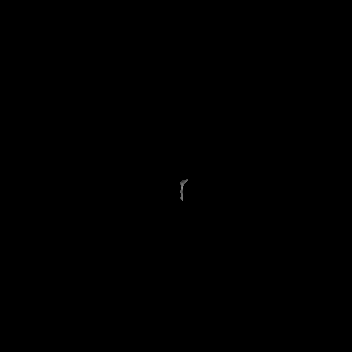

[Series 7: cor dwi_tracew · coronal · 5.0mm · 0.65mm/px · 3 of 40 slices shown]
[im 1/40]
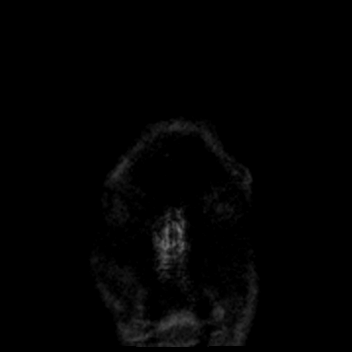
[im 20/40]
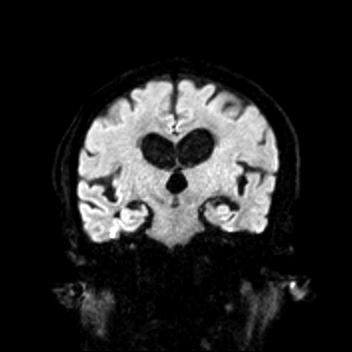
[im 40/40]
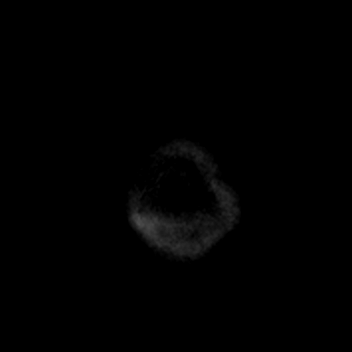

[Series 8: cor dwi_adc · coronal · 5.0mm · 0.65mm/px · 3 of 40 slices shown]
[im 1/40]
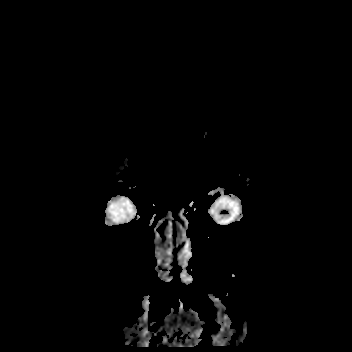
[im 20/40]
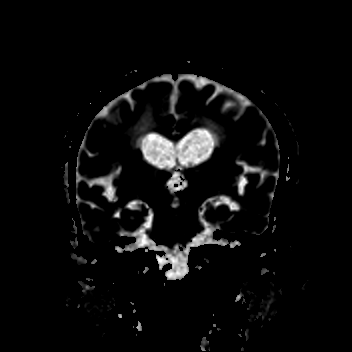
[im 40/40]
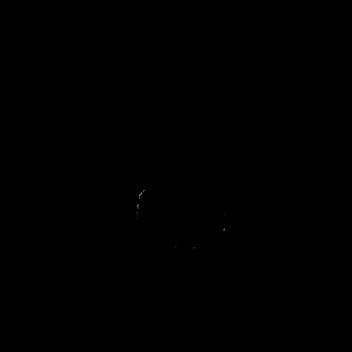

[Series 9: T1 · sagittal · 5.0mm · 0.62mm/px · 2 of 25 slices shown (1 of 2)]
[im 1/25]
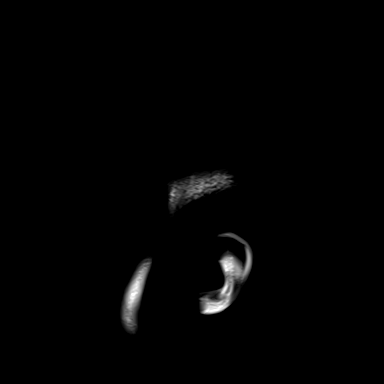
[im 25/25]
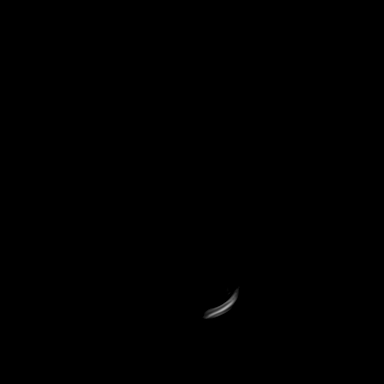

[Series 10: T2 · axial · 5.0mm · 0.53mm/px · z∈[-91,+52]mm · 2 of 25 slices shown (1 of 2)]
[im 1/25]
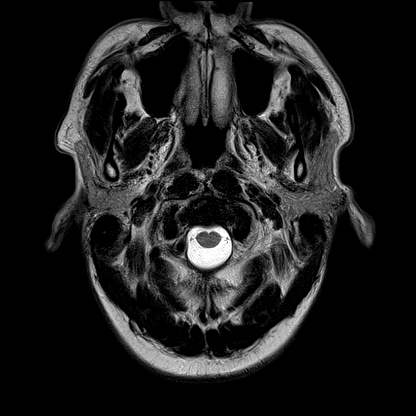
[im 25/25]
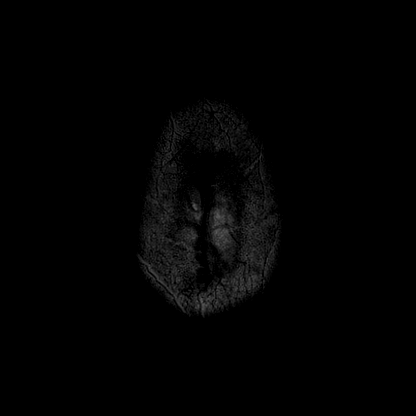

[Series 12: pha_images · axial · 3.0mm · 0.90mm/px · z∈[-107,+57]mm · 4 of 56 slices shown]
[im 1/56]
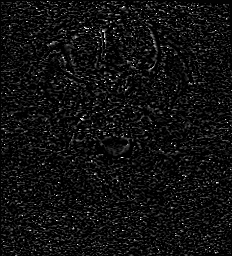
[im 19/56]
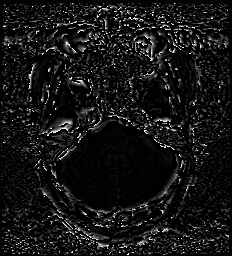
[im 37/56]
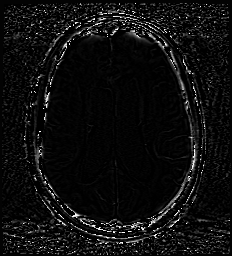
[im 56/56]
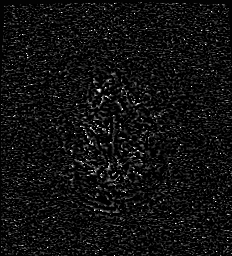

[Series 13: swi_images · axial · 3.0mm · 0.90mm/px · z∈[-107,+69]mm · 5 of 60 slices shown]
[im 1/60]
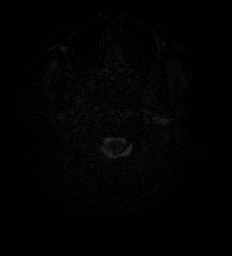
[im 15/60]
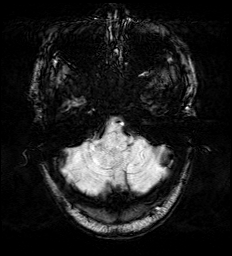
[im 30/60]
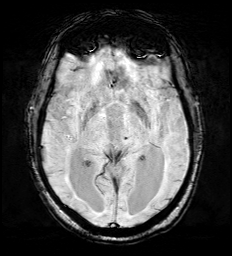
[im 45/60]
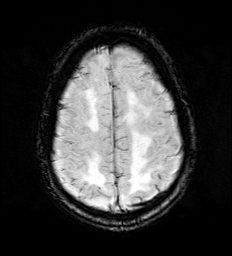
[im 60/60]
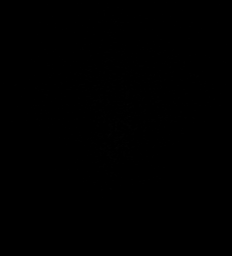

[Series 15: FLAIR · axial · 3.0mm · 0.53mm/px · z∈[-100,+61]mm · 4 of 55 slices shown]
[im 1/55]
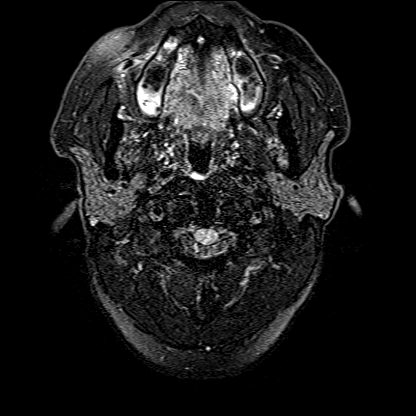
[im 19/55]
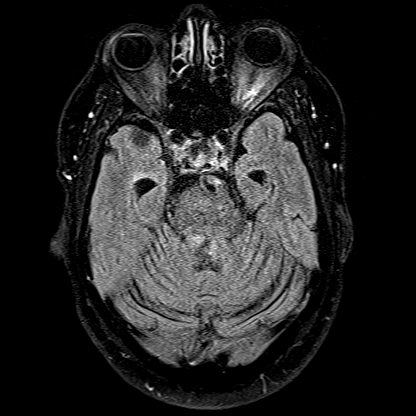
[im 37/55]
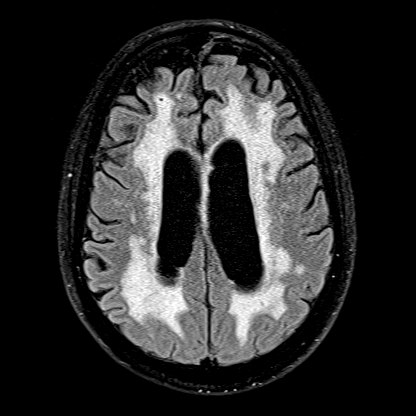
[im 55/55]
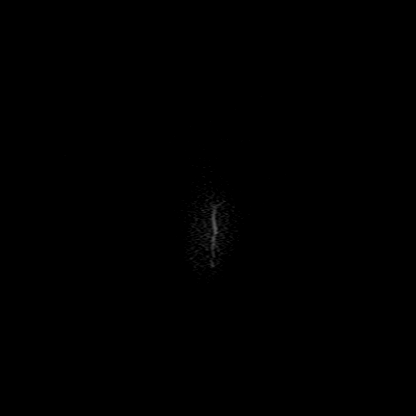

[Series 16: T1 · axial · 1.0mm · 0.98mm/px · z∈[-111,+63]mm · 9 of 176 slices shown (2 of 2)]
[im 1/176]
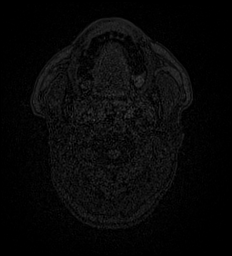
[im 14/176]
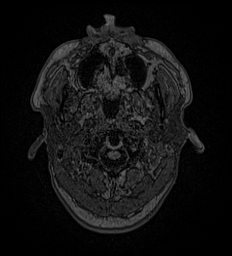
[im 27/176]
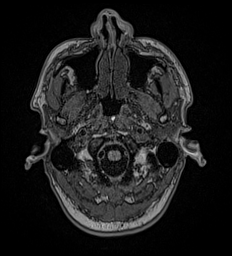
[im 54/176]
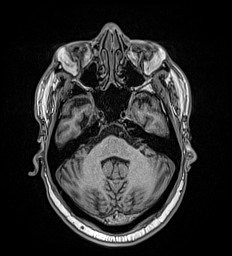
[im 81/176]
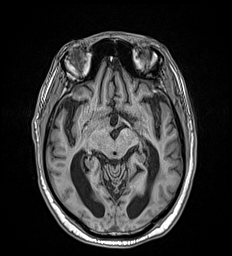
[im 95/176]
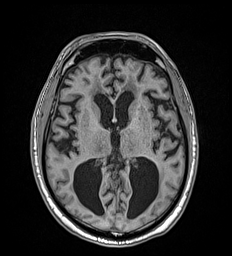
[im 122/176]
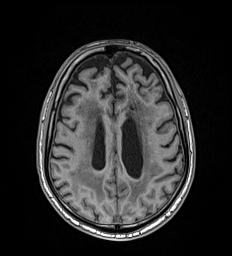
[im 149/176]
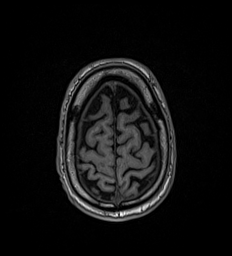
[im 176/176]
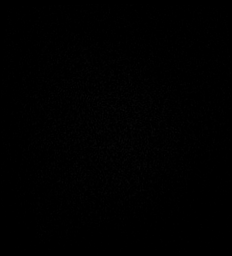

[Series 17: T2 · coronal · 5.0mm · 0.57mm/px · 2 of 29 slices shown (2 of 2)]
[im 1/29]
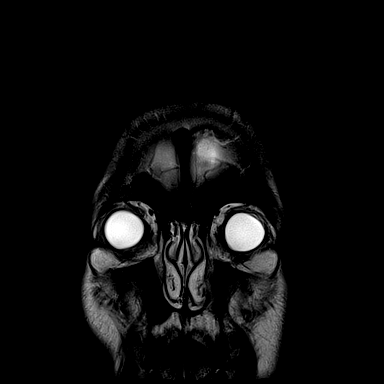
[im 29/29]
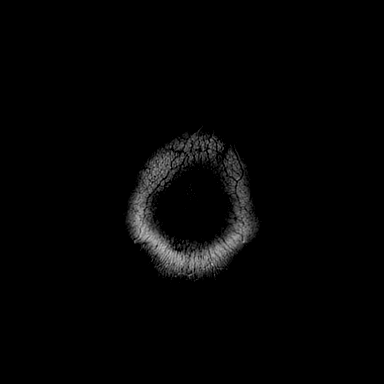

[43 of 48 positions shown; findings below may reference images not displayed]

FINDINGS: Brain: Diffuse prominence of the CSF containing spaces compatible
with generalized cerebral atrophy. Extensive patchy and confluent
T2/FLAIR hyperintensity involving the periventricular and deep white
matter both cerebral hemispheres as well as the pons, most like
related chronic microvascular ischemic disease, fairly severe in
appearance.

No evidence for acute or subacute ischemia. Gray-white matter
differentiation maintained. No areas of chronic cortical infarction.
No acute intracranial hemorrhage. Single punctate chronic
microhemorrhage noted within the left thalamus.

No mass lesion, midline shift, or mass effect. Diffuse ventricular
prominence somewhat out of proportion of cortical sulcation. While
this finding may be related to central atrophy, a degree of NPH
could be contributory. No extra-axial fluid collection. Pituitary
gland suprasellar region normal. Midline structures intact.

Vascular: Absent flow void within the hypoplastic left V4 segment,
consistent with occlusion as seen on prior CTA. Major intracranial
vascular flow voids are otherwise maintained.

Skull and upper cervical spine: Craniocervical junction within
normal limits. Bone marrow signal intensity normal. Subcentimeter
T1/T2 hyperintense lesion noted at the left parieto-occipital
calvarium, likely benign. No other focal marrow replacing lesions.
No scalp soft tissue abnormality.

Sinuses/Orbits: Prior ocular lens replacement on the right. Globes
and orbital soft tissues demonstrate no acute finding. Paranasal
sinuses are largely clear. No mastoid effusion.

Other: None.
IMPRESSION: 1. No acute intracranial abnormality.
2. Age-related cerebral atrophy with severe chronic microvascular
ischemic disease.
3. Diffuse ventricular prominence somewhat out of proportion to
cortical sulcation. While this finding may be related to central
atrophy, a degree of NPH may be contributory, and could be
considered in the correct clinical setting.

## 2022-01-13 IMAGING — CT CT ANGIO HEAD-NECK (W OR W/O PERF)
2 of 7 series · 8 of 33 positions shown · IV contrast (APPLIED)
Comparison: CT from earlier the same day.

CLINICAL DATA: Initial evaluation for acute stroke.

EXAM:
CT ANGIOGRAPHY HEAD AND NECK
TECHNIQUE: Multidetector CT imaging of the head and neck was performed using
the standard protocol during bolus administration of intravenous
contrast. Multiplanar CT image reconstructions and MIPs were
obtained to evaluate the vascular anatomy. Carotid stenosis
measurements (when applicable) are obtained utilizing NASCET
criteria, using the distal internal carotid diameter as the
denominator.

[Series 4: cta head neck · axial · 0.68mm/px · z∈[-252,-120]mm · 2 of 184 slices shown]
[im 62/184  soft-tissue]
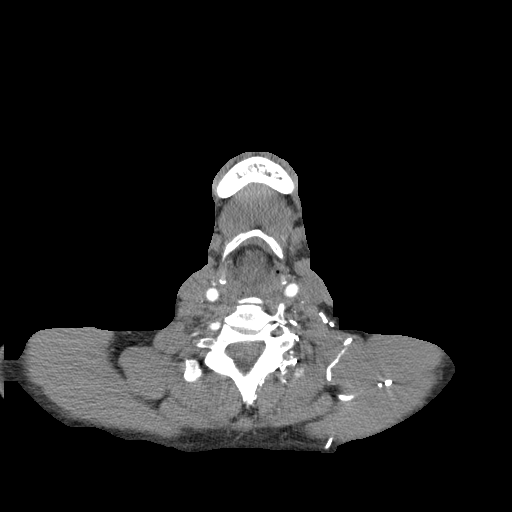
[im 123/184  soft-tissue]
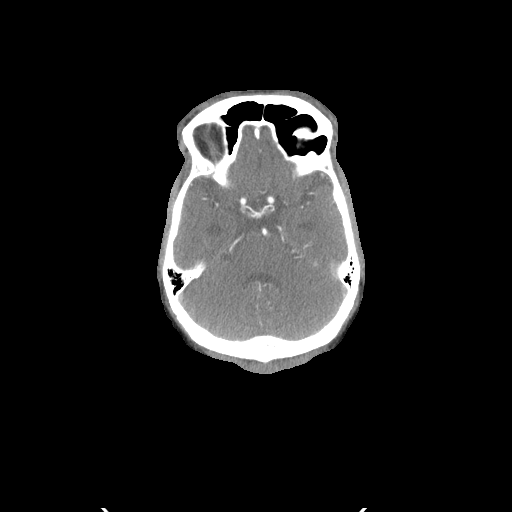

[Series 6: ax thin · axial · 0.55mm/px · z∈[-356,-89]mm · 6 of 385 slices shown]
[im 55/385  soft-tissue]
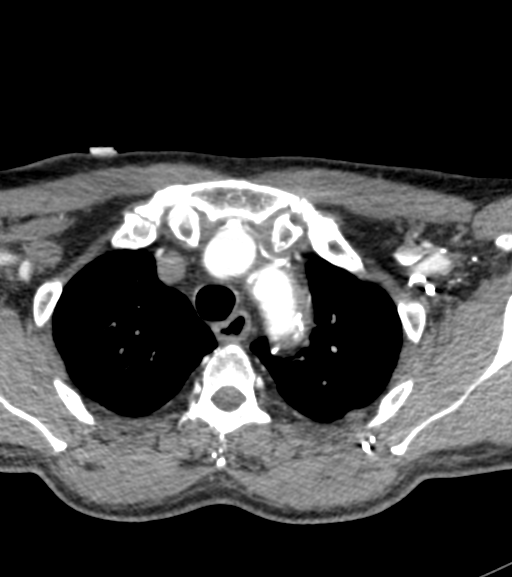
[im 110/385  bone]
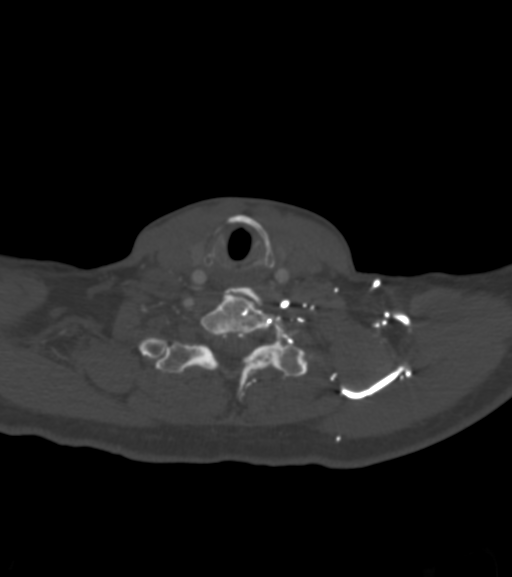
[im 165/385  soft-tissue]
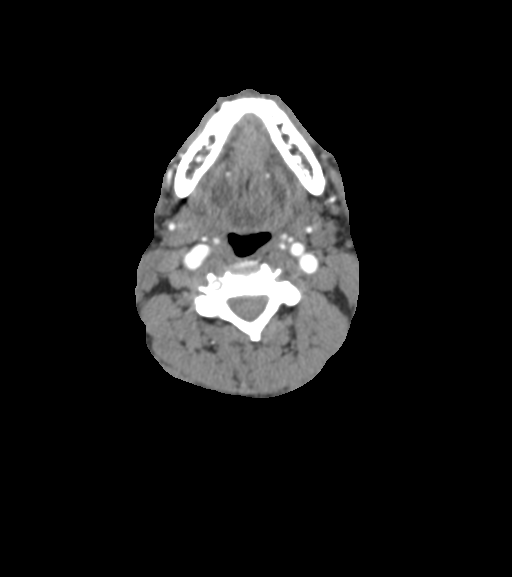
[im 220/385  bone]
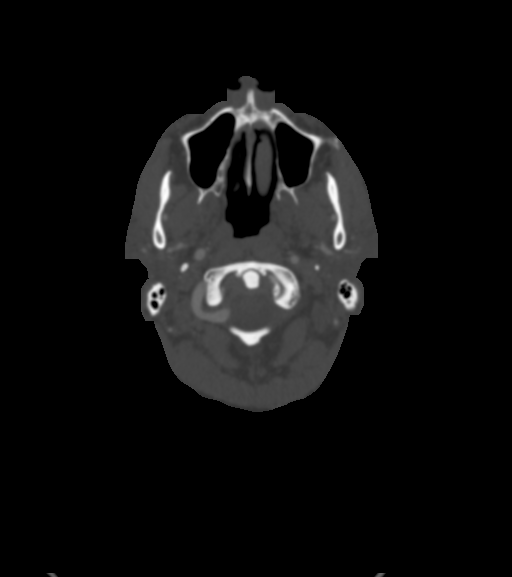
[im 275/385  soft-tissue]
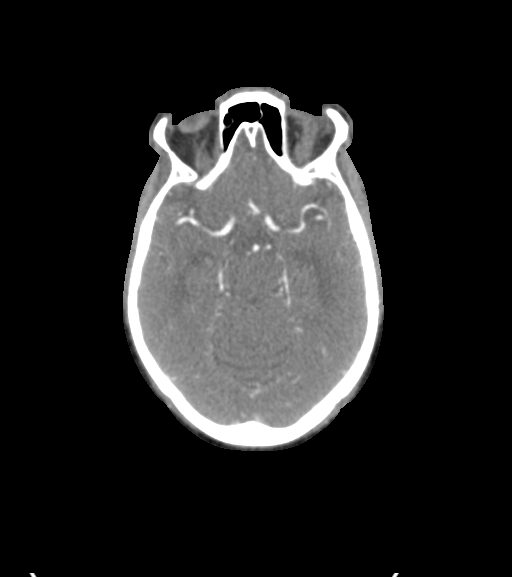
[im 330/385  bone]
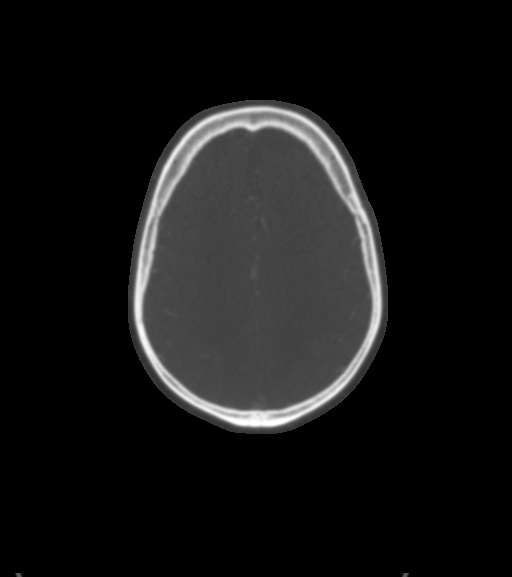

[8 of 33 positions shown; findings below may reference images not displayed]

RADIATION DOSE REDUCTION: This exam was performed according to the
departmental dose-optimization program which includes automated
exposure control, adjustment of the mA and/or kV according to
patient size and/or use of iterative reconstruction technique.

CONTRAST:  75mL OMNIPAQUE IOHEXOL 350 MG/ML SOLN
FINDINGS: CTA NECK FINDINGS

Aortic arch: Visualized aortic arch normal in caliber with normal
branch pattern. Mild plaque about the arch itself. No significant
stenosis about the origin of the great vessels.

Right carotid system: Right common and internal carotid arteries are
somewhat tortuous but widely patent without stenosis or dissection.

Left carotid system: Left common and internal carotid arteries are
tortuous but widely patent without stenosis or dissection.

Vertebral arteries: Left vertebral artery hypoplastic and arises
directly from the aortic arch. The hypoplastic left vertebral
arteries patent at its origin, but occludes at the mid-distal left
V1 segment. Left vertebral artery remains occluded distally within
the neck. Dominant right vertebral artery widely patent without
stenosis or dissection.

Skeleton: No worrisome lytic or blastic osseous lesions.
Mild-to-moderate multilevel cervical spondylosis without significant
spinal stenosis.

Other neck: No other acute soft tissue abnormality within the neck.

Upper chest: Few scattered subcentimeter calcified left hilar lymph
nodes partially visualized. Calcified granuloma noted within the
left upper lobe, consistent with prior granulomatous infection.
Visualized upper chest demonstrates no acute finding.

Review of the MIP images confirms the above findings

CTA HEAD FINDINGS

Anterior circulation: Both petrous segments widely patent. Mild
atheromatous change carotid siphons without hemodynamically
significant stenosis. A1 segments patent bilaterally. Normal
anterior communicating artery complex. Atheromatous irregularity
seen throughout the ACAs without high-grade stenosis.

Moderate atheromatous change throughout both M1 segments with
associated mild to moderate multifocal stenoses. Normal MCA
bifurcations. No proximal MCA branch occlusion. Moderate
atheromatous irregularity throughout the MCA branches bilaterally.

Posterior circulation: Dominant right vertebral artery widely patent
to the vertebrobasilar junction. Right PICA patent. Left vertebral
artery occluded at the skull base, and remains occluded to the
vertebrobasilar junction. Left PICA not seen. Atheromatous
irregularity within the basilar artery without high-grade stenosis.
[place] cerebral arteries patent bilaterally. Both PCAs primarily
supplied via the basilar. Focal moderate right P2 stenosis noted
(series 9, image 56). Focal severe left P2 stenosis noted as well
(series 9, image 56). Moderate atheromatous irregularity elsewhere
throughout the PCAs, but no other high-grade stenosis.

Venous sinuses: Grossly patent allowing for arterial timing of the
contrast bolus.

Anatomic variants: None significant.  No aneurysm.

Review of the MIP images confirms the above findings
IMPRESSION: 1. Negative CTA for emergent large vessel occlusion.
2. Hypoplastic left vertebral artery occluded at the proximal
mid-distal left V1 segment left vertebral artery remains occluded to
the vertebrobasilar junction. Dominant right vertebral artery widely
patent.
3. Moderate atherosclerotic change throughout the intracranial
circulation, with most notable findings including moderate to severe
bilateral P2 stenoses.
4. Diffuse tortuosity of the major arterial vasculature of the neck,
suggesting chronic underlying hypertension.

## 2022-01-13 IMAGING — CT CT HEAD CODE STROKE
4 series · 16 of 47 positions shown, 18 images · non-contrast
Comparison: Head CT [DATE] and MRI [DATE]

CLINICAL DATA: Code stroke.  Right-sided weakness.



[Series 3: head bone · axial · 0.49mm/px · z∈[-45,-13]mm · 3 of 84 slices shown]
[im 9/84  bone]
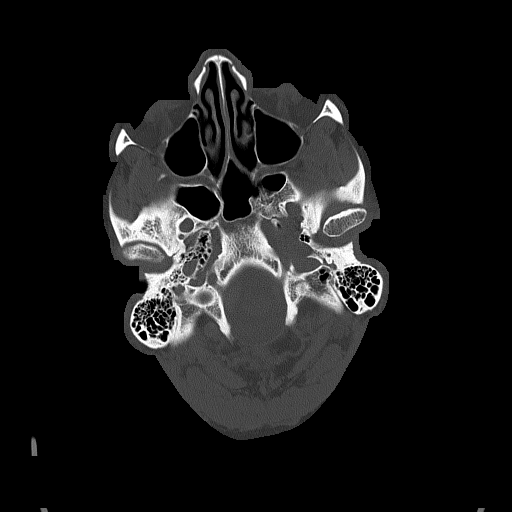
[im 17/84  bone]
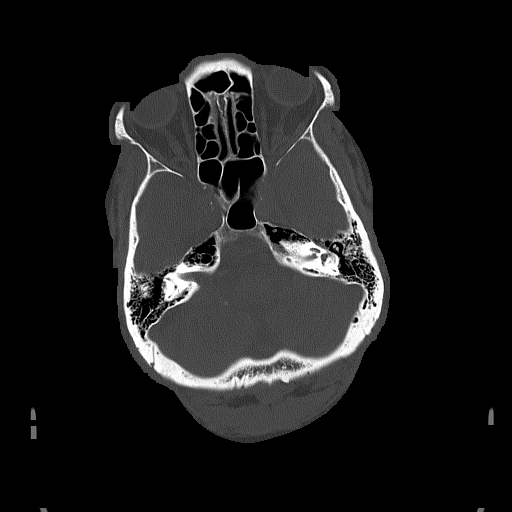
[im 25/84  bone]
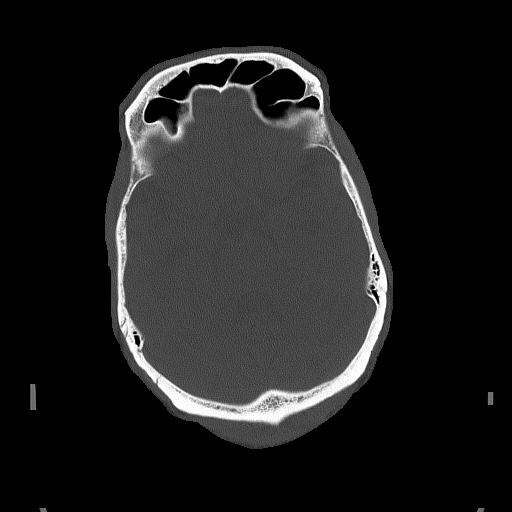

[Series 4: head wo · axial · 0.49mm/px · z∈[-41,+79]mm · 7 of 34 slices shown, 9 images]
[im 5/34  brain]
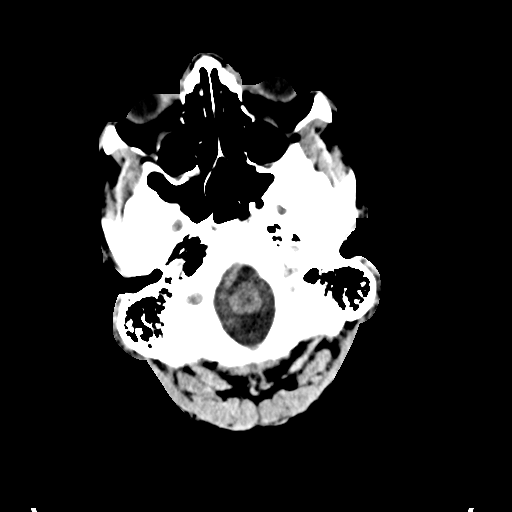
[im 5/34  bone]
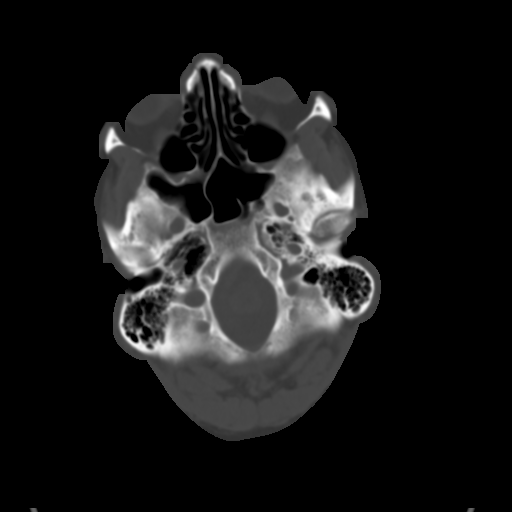
[im 9/34  brain]
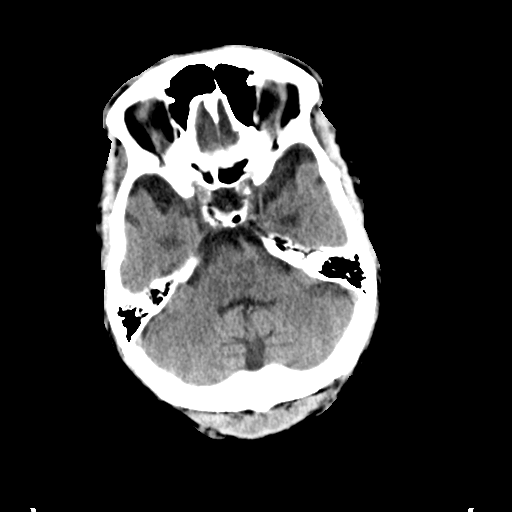
[im 13/34  brain]
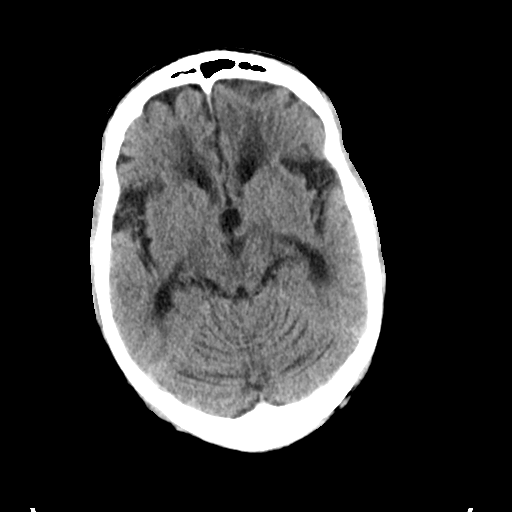
[im 17/34  brain]
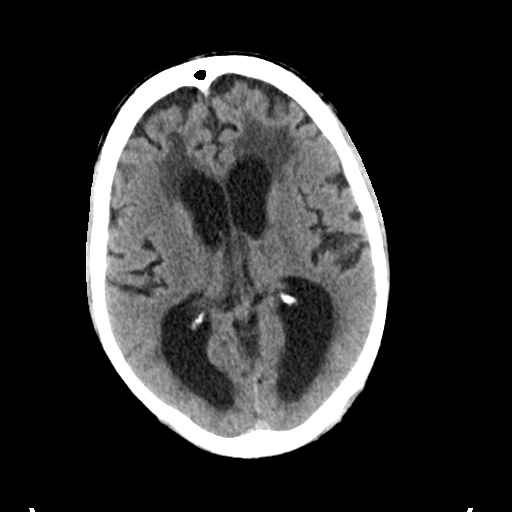
[im 21/34  brain]
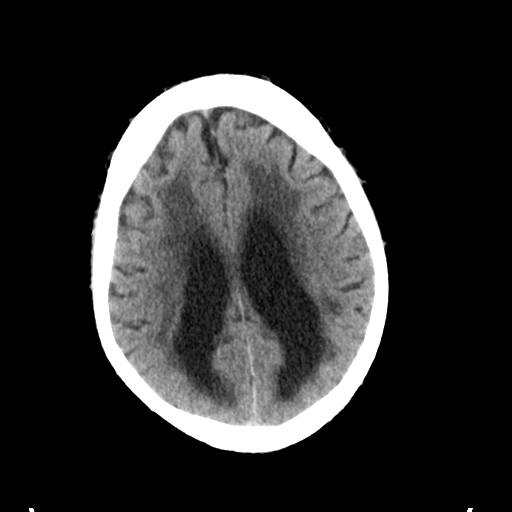
[im 21/34  bone]
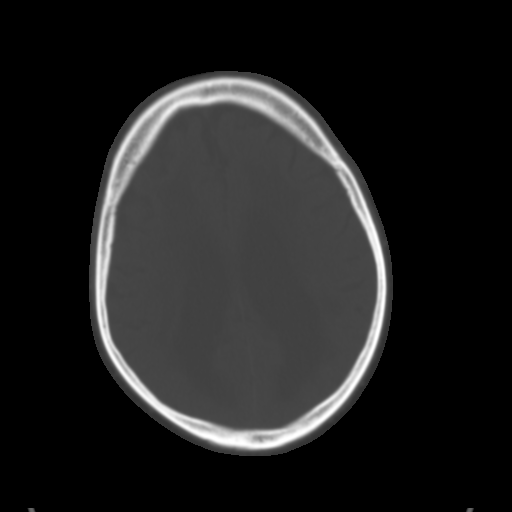
[im 25/34  brain]
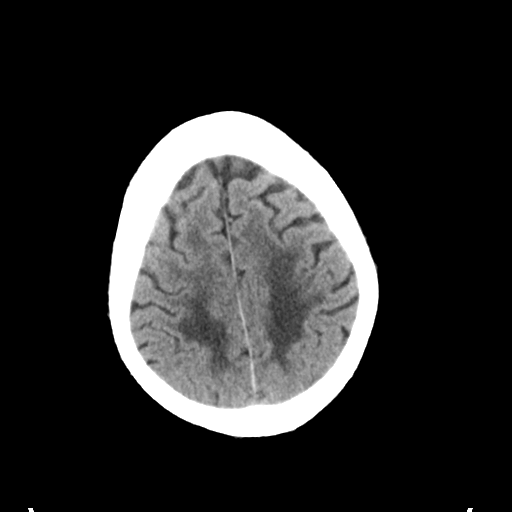
[im 29/34  brain]
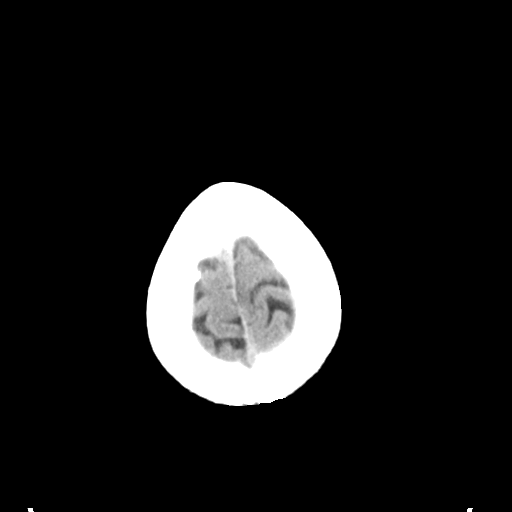

[Series 5: coronal soft tissue · coronal · 0.33mm/px · 3 of 72 slices shown]
[im 24/72  brain]
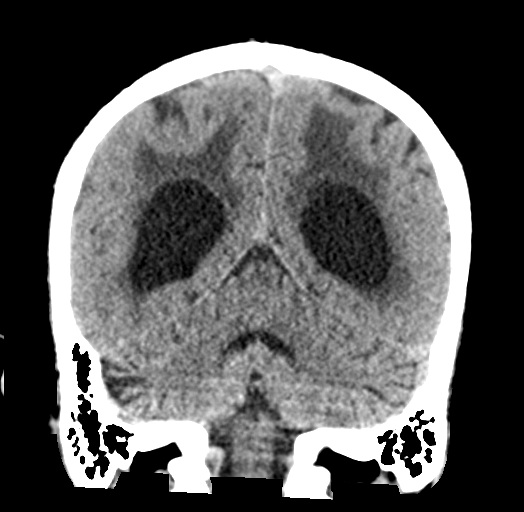
[im 32/72  brain]
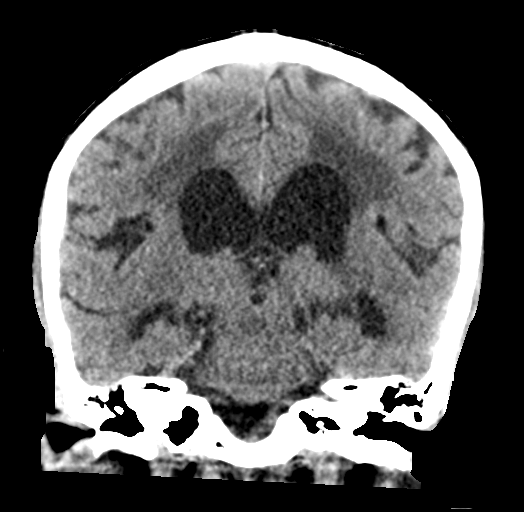
[im 40/72  brain]
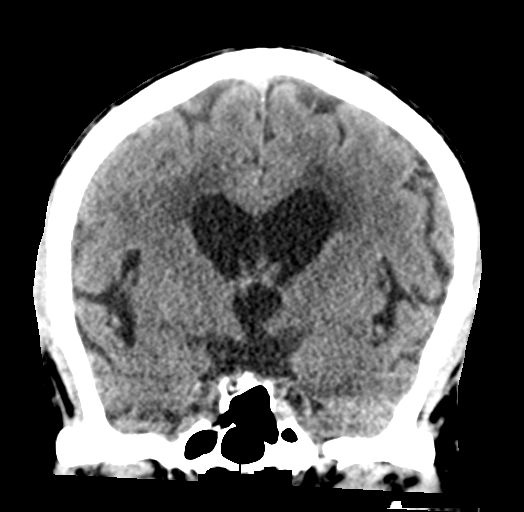

[Series 6: sagittal soft tissue · sagittal · 0.33mm/px · 3 of 59 slices shown]
[im 20/59  brain]
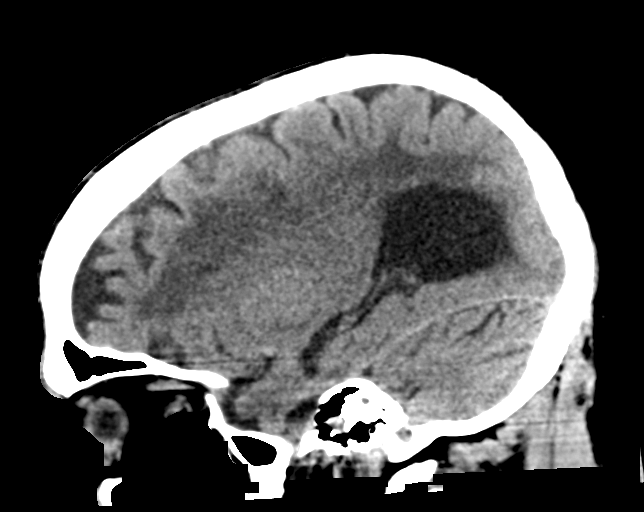
[im 30/59  brain]
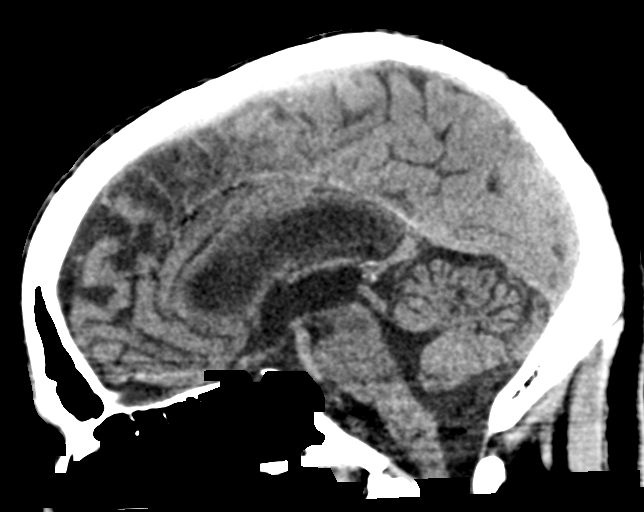
[im 39/59  brain]
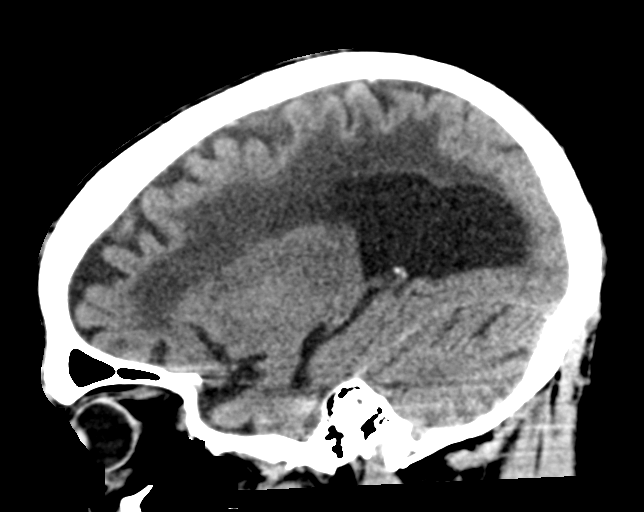

[16 of 47 positions shown; findings below may reference images not displayed]

FINDINGS: Brain: There is no evidence of an acute infarct, intracranial
hemorrhage, mass, midline shift, or extra-axial fluid collection.
Confluent hypodensities in the cerebral white matter bilaterally are
unchanged and nonspecific but compatible with severe chronic small
vessel ischemic disease. Ventriculomegaly is unchanged and may
reflect central predominant cerebral atrophy although normal
pressure hydrocephalus is not excluded in the appropriate clinical
setting.

Vascular: Calcified atherosclerosis at the skull base. No hyperdense
vessel.

Skull: No fracture or suspicious osseous lesion.

Sinuses/Orbits: Visualized paranasal sinuses and mastoid air cells
are clear. Right cataract extraction.

Other: None.

ASPECTS ([place] Stroke Program Early CT Score)

- Ganglionic level infarction (caudate, lentiform nuclei, internal
capsule, insula, M1-M3 cortex): 7

- Supraganglionic infarction (M4-M6 cortex): 3

Total score (0-10 with 10 being normal): 10
IMPRESSION: 1. No evidence of acute intracranial abnormality. ASPECTS of 10.
2. Severe chronic small vessel ischemic disease.

These results were communicated to Dr. JIM at [DATE] on
[DATE] by text page via the [employer] messaging system.

## 2022-01-13 MED ORDER — SODIUM CHLORIDE 0.9% FLUSH
3.0000 mL | Freq: Once | INTRAVENOUS | Status: AC
  Administered 2022-01-13: 3 mL via INTRAVENOUS

## 2022-01-13 MED ORDER — MEMANTINE HCL 5 MG PO TABS
10.0000 mg | ORAL_TABLET | Freq: Two times a day (BID) | ORAL | Status: DC
  Administered 2022-01-13 – 2022-01-14 (×2): 10 mg via ORAL
  Filled 2022-01-13 (×2): qty 2

## 2022-01-13 MED ORDER — ACETAMINOPHEN 650 MG RE SUPP: 650.0000 mg | RECTAL | Status: DC | PRN

## 2022-01-13 MED ORDER — BUPROPION HCL ER (XL) 150 MG PO TB24: 300.0000 mg | ORAL_TABLET | Freq: Every day | ORAL | Status: DC

## 2022-01-13 MED ORDER — HEPARIN SODIUM (PORCINE) 5000 UNIT/ML IJ SOLN
5000.0000 [IU] | Freq: Three times a day (TID) | INTRAMUSCULAR | Status: DC
  Administered 2022-01-13 – 2022-01-14 (×2): 5000 [IU] via SUBCUTANEOUS
  Filled 2022-01-13 (×2): qty 1

## 2022-01-13 MED ORDER — STROKE: EARLY STAGES OF RECOVERY BOOK: Freq: Once | Status: AC

## 2022-01-13 MED ORDER — SODIUM CHLORIDE 0.9 % IV SOLN: INTRAVENOUS | Status: AC

## 2022-01-13 MED ORDER — ASPIRIN EC 81 MG PO TBEC
81.0000 mg | DELAYED_RELEASE_TABLET | Freq: Every day | ORAL | Status: DC
Start: 2022-01-13 — End: 2022-01-14
  Administered 2022-01-13 – 2022-01-14 (×2): 81 mg via ORAL
  Filled 2022-01-13 (×2): qty 1

## 2022-01-13 MED ORDER — ASPIRIN EC 81 MG PO TBEC: 81.0000 mg | DELAYED_RELEASE_TABLET | Freq: Once | ORAL | Status: DC

## 2022-01-13 MED ORDER — BUPROPION HCL ER (XL) 150 MG PO TB24: 150.0000 mg | ORAL_TABLET | Freq: Every day | ORAL | Status: DC

## 2022-01-13 MED ORDER — IOHEXOL 350 MG/ML SOLN
75.0000 mL | Freq: Once | INTRAVENOUS | Status: AC | PRN
  Administered 2022-01-13: 75 mL via INTRAVENOUS

## 2022-01-13 MED ORDER — ACETAMINOPHEN 325 MG PO TABS: 650.0000 mg | ORAL_TABLET | ORAL | Status: DC | PRN

## 2022-01-13 MED ORDER — CLOPIDOGREL BISULFATE 75 MG PO TABS
75.0000 mg | ORAL_TABLET | Freq: Every day | ORAL | Status: DC
  Administered 2022-01-13 – 2022-01-14 (×2): 75 mg via ORAL
  Filled 2022-01-13 (×2): qty 1

## 2022-01-13 MED ORDER — ACETAMINOPHEN 160 MG/5ML PO SOLN
650.0000 mg | ORAL | Status: DC | PRN
  Filled 2022-01-13: qty 20.3

## 2022-01-13 MED ORDER — SENNOSIDES-DOCUSATE SODIUM 8.6-50 MG PO TABS: 1.0000 | ORAL_TABLET | Freq: Every evening | ORAL | Status: DC | PRN

## 2022-01-13 MED ORDER — DONEPEZIL HCL 5 MG PO TABS
10.0000 mg | ORAL_TABLET | Freq: Every day | ORAL | Status: DC
  Administered 2022-01-14: 10:00:00 10 mg via ORAL
  Filled 2022-01-13: qty 2

## 2022-01-13 NOTE — ED Notes (Signed)
Gave verbal report to Stanton Kidney, Environmental consultant). Once pt arrives back to the ED from CT, this RN will COVID swab him and send off 2nd troponin. ?

## 2022-01-13 NOTE — ED Provider Notes (Signed)
? ?[facility] ?Provider Note ? ? ? Event Date/Time  ? First MD Initiated Contact with Patient 01/13/22 1636   ?  (approximate) ? ? ?History  ? ?Weakness ? ? ?HPI ? ?Robert Zamora is a 81 y.o. 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  Patient is not the best historian but did report that he was walking with his age when he had sudden onset of right-sided weakness.  He felt like his leg was giving out.  Patient reports that he feels stronger now but he is unable to stand himself up.  There is also some concern of an issue with not being able to talk.  They deny any history of known stroke but he is followed by neurology.  Denies any other symptoms. ? ?Physical Exam  ? ?Triage Vital Signs: ?ED Triage Vitals  ?Enc Vitals Group  ?   BP 01/13/22 1624 (!) 138/92  ?   Pulse Rate 01/13/22 1624 82  ?   Resp 01/13/22 1624 18  ?   Temp 01/13/22 1624 97.9 ?F (36.6 ?C)  ?   Temp Source 01/13/22 1624 Oral  ?   SpO2 01/13/22 1624 97 %  ?   Weight --   ?   Height --   ?   Head Circumference --   ?   Peak Flow --   ?   Pain Score 01/13/22 1625 0  ?   Pain Loc --   ?   Pain Edu? --   ?   Excl. in [place]? --   ? ? ?Most recent vital signs: ?Vitals:  ? 01/13/22 1624  ?BP: (!) 138/92  ?Pulse: 82  ?Resp: 18  ?Temp: 97.9 ?F (36.6 ?C)  ?SpO2: 97%  ? ? ? ?General: Awake, no distress.  ?CV:  Good peripheral perfusion.  ?Resp:  Normal effort.  ?Abd:  No distention.  ?Other:  Patient has a little bit of right pronator drift.  Difficulty to assess his right leg.  Patient unable to stand up.  Patient not the best historian. ? ? ?ED Results / Procedures / Treatments  ? ?Labs ?(all labs ordered are listed, but only abnormal results are displayed) ?Labs Reviewed  ?PROTIME-INR  ?APTT  ?CBC  ?DIFFERENTIAL  ?COMPREHENSIVE METABOLIC PANEL  ?I-STAT CREATININE, ED  ?CBG MONITORING, ED  ? ? ? ?EKG ? ?My interpretation of EKG: ? ?Normal sinus rate of 62  with any ST elevation but some T wave inversions in 3 and aVF V4 through V6 with right bundle branch block, left anterior fascicular block, ? ?Repeat EKG is sinus bradycardia with a rate of 56 with similar ST elevation and T wave inversions with similar intervals. ? ?I reviewed his prior EKG from August 11, 1941 had some similar aVR elevation but the T wave inversions do look a new ? ? ?Repeat EKG is sinus rate of 62 with similar T wave inversions in lead III and aVF ? ? ?RADIOLOGY ?I have reviewed the CT personally- no ICH  ? ?PROCEDURES: ? ?Critical Care performed: No ? ?Procedures ? ? ?MEDICATIONS ORDERED IN ED: ?Medications  ?sodium chloride flush (NS) 0.9 % injection 3 mL (has no administration in time range)  ? ? ? ?IMPRESSION / MDM / ASSESSMENT AND PLAN / ED COURSE  ?I reviewed the triage vital signs and the nursing notes. ? ?Patient with known vascular dementia who comes in with acute onset  of right-sided weakness that seems to be slightly improving but a little bit of pronator drift still in the right arm on my examination therefore stroke code called.  CT head ordered evaluate for any intracranial hemorrhage, stroke, labs to evaluate for any Electra abnormalities, AKI, glucose issues.  He denies any chest pain or shortness of breath but his EKG did have a little bit of T wave inversions to lateral troponin to make sure not atypical presentation of ACS.  Dr. Cheral Marker from neurology consulted for stroke code.  ? ?INR is normal.  Glucose normal.  CBC normal.  CMP shows slightly elevated creatinine but similar to prior. ? ?CT head was negative. ? ?Discussed with Dr. Cheral Marker who recommends admission for TIA work-up ? ?Patient does have abnormal EKGs but they are stable upon repeat initial troponin was 20.  Will get repeat and discuss with hospital team for admission.  Patient is denying any chest pain or shortness of breath he understands that if he develops any symptoms to please let us know. ? ? ? ? ? ? ?FINAL  CLINICAL IMPRESSION(S) / ED DIAGNOSES  ? ?Final diagnoses:  ?TIA (transient ischemic attack)  ? ? ? ?Rx / DC Orders  ? ?ED Discharge Orders   ? ? None  ? ?  ? ? ? ?Note:  This document was prepared using Dragon voice recognition software and may include unintentional dictation errors. ?  ?Vanessa Chaparrito, MD ?01/13/22 1859 ? ?

## 2022-01-13 NOTE — ED Notes (Signed)
Pt with unclear story, unclear timeframe and complex history arrives to h/w stretcher with concern for stroke. Dr. Jari Pigg at [facility]. Wife present. H/o multi-type dementia, and negative parkinsons w/u per spouse. Pt alert, NAD, calm, interactive, speech clear. More difficulty walking than usual. Usually uses cane. Code stroke called. Unsuccessful IV attempt/ blood draw. Limited exam. CT ready. Transferred from w/c to stretcher. To CT with RN  ?

## 2022-01-13 NOTE — Consult Note (Signed)
?                    NEURO HOSPITALIST CONSULT NOTE  ? ?Requestig physician: Dr. Jari Pigg ? ?Reason for Consult: Acute onset of right sided weakness ? ?History obtained from:   Patient and Chart    ? ?HPI:                                                                                                                                         ? Robert Zamora is an 81 y.o. male with a PMHx of vascular dementia, GERD, HTN and urinary incontinence who presents with acute onset of right arm and leg weakness. Symptoms started at about 3:20 PM. He was walking when he had sudden onset of right-sided weakness; he felt like his leg was giving out. After arrival to the ED, the patient reported that he felt stronger, but was still unable to stand himself up. There was some question of him not being able to talk. BP on presenting to the ED was 138/92. He denies any sensory symptoms, facial weakness or vision changes. States he has had episodes like this before, but no prior stroke diagnosis.  ? ?He is followed as an outpatient by Neurology. Home medications include ASA, donepezil and memantine. At baseline he usually uses a cane for ambulation.  ? ?Past Medical History:  ?Diagnosis Date  ? Alzheimer disease ([place])   ? GERD (gastroesophageal reflux disease)   ? Hypertension   ? Urinary incontinence   ? ? ?Past Surgical History:  ?Procedure Laterality Date  ? CIRCUMCISION    ? ? ?Family History  ?Problem Relation Age of Onset  ? Stroke Mother   ? Stroke Father   ? Cancer Neg Hx   ?          ? ?Social History:  reports that he has quit smoking. He has never used smokeless tobacco. He reports that he does not currently use alcohol. He reports that he does not use drugs. ? ?No Known Allergies ? ?HOME MEDICATIONS:                                                                                                                     ? ?No current facility-administered medications on file prior to encounter.  ? ?Current Outpatient Medications on  File Prior to Encounter  ?Medication Sig Dispense Refill  ?  aspirin 81 MG EC tablet Take by mouth.    ? buPROPion (WELLBUTRIN XL) 300 MG 24 hr tablet Take by mouth.    ? Cholecalciferol 25 MCG (1000 UT) capsule Take by mouth.    ? Cranberry 400 MG CAPS Take by mouth.    ? donepezil (ARICEPT) 10 MG tablet Take 1 tablet by mouth daily.    ? memantine (NAMENDA) 10 MG tablet Take 1 tablet by mouth 2 (two) times daily.    ? Mirabegron (MYRBETRIQ PO) Take by mouth.    ? olmesartan (BENICAR) 40 MG tablet Take by mouth.    ? Turmeric 500 MG CAPS Take by mouth. Turmeric/Ginger tablet 1 QD    ? ? ? ?ROS:                                                                                                                                       ?As per HPI. The patient denies any additional symptoms.  ? ? ?Blood pressure (!) 138/92, pulse 82, temperature 97.9 ?F (36.6 ?C), temperature source Oral, resp. rate 18, SpO2 97 %. ? ? ?General Examination:                                                                                                      ? ?Physical Exam  ?HEENT-  [place]/AT   ?Lungs- Respirations unlabored ?Extremities- Warm and well perfused ? ?Neurological Examination ?Mental Status: Awake and alert. Fully oriented except for the month. Speech fluent and non-dysarthric, but with decreased prosodic content. Naming intact. Able to follow all commands.  ?Cranial Nerves: ?II: Visual fields intact.   ?III,IV, VI: No ptosis. EOM are full with saccadic quality of visual pursuits noted.  ?V: Temp sensation equal bilaterally ?VII: Smile symmetric.  ?VIII: Hearing intact to voice ?IX,X: Mildly hypophonic speech ?XI: Symmetric ?XII: Midline tongue. No lingual dysarthria.  ?Motor: ?BUE 5/5 proximally and distally without asymmetry. No pronator drift. Negative orbiting fingers test.  ?BLE 5/5 bilaterally.  ?Mild rigidity of BUE and BLE to passive motion.  ?Sensory: Temp sensation intact to BUE. FT intact x 4. No extinction to DSS.  ?Deep  Tendon Reflexes: 0 right brachioradialis and biceps. 1+ left brachioradialis and biceps. 0 bilateral patellae and achilles.  ?Plantars: Equivocal bilaterally ?Cerebellar: No ataxia with FNF bilaterally. No tremor noted.  ?Gait: Deferred ?  ?Lab Results: ?Basic Metabolic Panel: ?No results for input(s): NA, K, CL, CO2, GLUCOSE, BUN, CREATININE, CALCIUM, MG, PHOS in the last 168 hours. ? ?  CBC: ?No results for input(s): WBC, NEUTROABS, HGB, HCT, MCV, PLT in the last 168 hours. ? ?Cardiac Enzymes: ?No results for input(s): CKTOTAL, CKMB, CKMBINDEX, TROPONINI in the last 168 hours. ? ?Lipid Panel: ?No results for input(s): CHOL, TRIG, HDL, CHOLHDL, VLDL, LDLCALC in the last 168 hours. ? ?Imaging: ?No results found. ? ? ?Assessment: 81 year old male with a history of Alzheimer disease presenting with acute onset of right sided weakness. ?1. Exam reveals no lateralized deficits. He does have hypomimia, mild hypophonia and decreased prosodic quality to his speech in conjunction with subtle limb rigidity suggestive of a possible Parkinsonian syndrome. However, per wife he has had a negative Parkinson's work up in the past.  ?2. CT head: No evidence of acute intracranial abnormality. ASPECTS of 10. Severe chronic small vessel ischemic disease. ?3. Overall presentation is most consistent with a TIA, localizable to the left hemisphere given transient right sided weakness and speech deficit.  ?4. NIHSS 0. No indication for IV thrombolysis.  ? ?Recommendations: ?1. TIA work up to include MRI brain, CTA of head and neck, TTE and cardiac telemetry.  ?2. HgbA1c, fasting lipid panel ?3. PT consult, OT consult, Speech consult ?4. Add Plavix to ASA. He is classifiable as having failed ASA monotherapy.  ?5. Risk factor modification ?6. Frequent neuro checks ?7. NPO until passes stroke swallow screen ?8. Continue donepezil and memantine ?9. Outpatient Neurology follow up for possible NPH given enlarged ventricles on CT head, mild  Parkinsonian features on exam, history of cognitive impairment and history of urinary incontinence.   ? ? ? ?Electronically signed: Dr. Kerney Elbe ?01/13/2022, 4:42 PM ? ? ?   ?

## 2022-01-13 NOTE — H&P (Addendum)
History and Physical    Dak Szumski XBW:[id number] DOB: 1941/02/06 DOA: 01/13/2022  PCP: Leonel Ramsay, MD   Patient coming from: Home   Chief Complaint: right leg weakness, confusion   HPI: Robert Zamora is an 81 y.o. male with medical history significant for hypertension, CKD 3, dementia, and chronic venous insufficiency, who presented to the emergency department with right-sided weakness and confusion.  He is accompanied by his wife assists with the history.  The patient was reportedly in his usual state when he developed right leg weakness that approximately 3 PM while walking with his aide.  He appeared to be dragging the right leg, and also seem to have some confusion per report of family.  In the ED, patient reports feeling back to his normal self and his wife also notes that he seems to be back to baseline.  ED Course: Upon arrival to the ED, patient is found to be afebrile and saturating well on room air with stable blood pressure.  EKG features sinus rhythm with first-degree AV nodal block, RBBB, and LAFB.  Head CT negative for acute intracranial abnormality.  Neurology was consulted and recommended admission for TIA work-up.  Review of Systems:  All other systems reviewed and apart from HPI, are negative.  Past Medical History:  Diagnosis Date   Alzheimer disease ([place])    GERD (gastroesophageal reflux disease)    Hypertension    Urinary incontinence     Past Surgical History:  Procedure Laterality Date   CIRCUMCISION      Social History:   reports that he has quit smoking. He has never used smokeless tobacco. He reports that he does not currently use alcohol. He reports that he does not use drugs.  No Known Allergies  Family History  Problem Relation Age of Onset   Stroke Mother    Stroke Father    Cancer Neg Hx      Prior to Admission medications   Medication Sig Start Date End Date Taking? Authorizing Provider  aspirin 81 MG EC tablet Take by mouth.     [provider]  buPROPion (WELLBUTRIN XL) 300 MG 24 hr tablet Take by mouth. 10/30/21   [provider]  Cholecalciferol 25 MCG (1000 UT) capsule Take by mouth.    [provider]  Cranberry 400 MG CAPS Take by mouth.    [provider]  donepezil (ARICEPT) 10 MG tablet Take 1 tablet by mouth daily. 03/30/21   [provider]  memantine (NAMENDA) 10 MG tablet Take 1 tablet by mouth 2 (two) times daily. 10/14/21   [provider]  Mirabegron (MYRBETRIQ PO) Take by mouth.    [provider]  olmesartan (BENICAR) 40 MG tablet Take by mouth. 12/24/20   [provider]  Turmeric 500 MG CAPS Take by mouth. Turmeric/Ginger tablet 1 QD    [provider]    Physical Exam: Vitals:   01/13/22 1755 01/13/22 1756 01/13/22 1800 01/13/22 1810  BP:  (!) 151/98 (!) 158/93 (!) 155/90  Pulse: 65 61 63 62  Resp: '18 17 19 16  '$ Temp:      TempSrc:      SpO2: 97% 94% 94% 96%    Constitutional: NAD, calm  Eyes: PERTLA, lids and conjunctivae normal ENMT: Mucous membranes are moist. Posterior pharynx clear of any exudate or lesions.   Neck: supple, no masses  Respiratory: clear to auscultation bilaterally, no wheezing, no crackles. No accessory muscle use.  Cardiovascular: S1 & S2  heard, regular rate and rhythm. No extremity edema.   Abdomen: No distension, no tenderness, soft. Bowel sounds active.  Musculoskeletal: no clubbing / cyanosis. No joint deformity upper and lower extremities.   Skin: no significant rashes, lesions, ulcers. Warm, dry, well-perfused. Neurologic: CN 2-12 grossly intact. Sensation to light touch intact. Strength 5/5 in all 4 limbs. Alert and oriented to person, place, and situation.  Psychiatric: Calm. Cooperative.    Labs and Imaging on Admission: I have personally reviewed following labs and imaging studies  CBC: Recent Labs  Lab 01/13/22 1738  WBC 5.1  NEUTROABS 3.1  HGB 13.5  HCT 40.6  MCV  83.7  PLT 361   Basic Metabolic Panel: Recent Labs  Lab 01/13/22 1738  NA 137  K 4.4  CL 105  CO2 24  GLUCOSE 95  BUN 16  CREATININE 1.68*  CALCIUM 8.7*   GFR: CrCl cannot be calculated (Unknown ideal weight.). Liver Function Tests: Recent Labs  Lab 01/13/22 1738  AST 19  ALT 18  ALKPHOS 62  BILITOT 0.7  PROT 7.2  ALBUMIN 3.4*   No results for input(s): LIPASE, AMYLASE in the last 168 hours. No results for input(s): AMMONIA in the last 168 hours. Coagulation Profile: Recent Labs  Lab 01/13/22 1738  INR 1.0   Cardiac Enzymes: No results for input(s): CKTOTAL, CKMB, CKMBINDEX, TROPONINI in the last 168 hours. BNP (last 3 results) No results for input(s): PROBNP in the last 8760 hours. HbA1C: No results for input(s): HGBA1C in the last 72 hours. CBG: Recent Labs  Lab 01/13/22 1754  GLUCAP 88   Lipid Profile: No results for input(s): CHOL, HDL, LDLCALC, TRIG, CHOLHDL, LDLDIRECT in the last 72 hours. Thyroid Function Tests: No results for input(s): TSH, T4TOTAL, FREET4, T3FREE, THYROIDAB in the last 72 hours. Anemia Panel: No results for input(s): VITAMINB12, FOLATE, FERRITIN, TIBC, IRON, RETICCTPCT in the last 72 hours. Urine analysis:    Component Value Date/Time   COLORURINE YELLOW 12/22/2021 1350   APPEARANCEUR CLEAR 12/22/2021 1350   APPEARANCEUR Cloudy (A) 09/09/2021 1427   LABSPEC 1.015 12/22/2021 1350   PHURINE 7.0 12/22/2021 1350   GLUCOSEU NEGATIVE 12/22/2021 1350   HGBUR NEGATIVE 12/22/2021 1350   BILIRUBINUR NEGATIVE 12/22/2021 1350   BILIRUBINUR Negative 09/09/2021 1427   KETONESUR NEGATIVE 12/22/2021 1350   PROTEINUR 100 (A) 12/22/2021 1350   NITRITE NEGATIVE 12/22/2021 1350   LEUKOCYTESUR NEGATIVE 12/22/2021 1350   Sepsis Labs: '@LABRCNTIP'$ (procalcitonin:4,lacticidven:4) )No results found for this or any previous visit (from the past 240 hour(s)).   Radiological Exams on Admission: CT HEAD CODE STROKE WO CONTRAST  Result Date:  01/13/2022 CLINICAL DATA:  Code stroke.  Right-sided weakness. EXAM: CT HEAD WITHOUT CONTRAST TECHNIQUE: Contiguous axial images were obtained from the base of the skull through the vertex without intravenous contrast. RADIATION DOSE REDUCTION: This exam was performed according to the departmental dose-optimization program which includes automated exposure control, adjustment of the mA and/or kV according to patient size and/or use of iterative reconstruction technique. COMPARISON:  Head CT 08/10/2021 and MRI 05/15/2021 FINDINGS: Brain: There is no evidence of an acute infarct, intracranial hemorrhage, mass, midline shift, or extra-axial fluid collection. Confluent hypodensities in the cerebral white matter bilaterally are unchanged and nonspecific but compatible with severe chronic small vessel ischemic disease. Ventriculomegaly is unchanged and may reflect central predominant cerebral atrophy although normal pressure hydrocephalus is not excluded in the appropriate clinical setting. Vascular: Calcified atherosclerosis at the skull base. No hyperdense vessel. Skull: No fracture or suspicious  osseous lesion. Sinuses/Orbits: Visualized paranasal sinuses and mastoid air cells are clear. Right cataract extraction. Other: None. ASPECTS [facility] Stroke Program Early CT Score) - Ganglionic level infarction (caudate, lentiform nuclei, internal capsule, insula, M1-M3 cortex): 7 - Supraganglionic infarction (M4-M6 cortex): 3 Total score (0-10 with 10 being normal): 10 IMPRESSION: 1. No evidence of acute intracranial abnormality. ASPECTS of 10. 2. Severe chronic small vessel ischemic disease. These results were communicated to Dr. Cheral Marker at [place] pm on 01/13/2022 by text page via the [facility] messaging system. Electronically Signed   By: Logan Bores M.D.   On: 01/13/2022 17:03    EKG: Independently reviewed. SR, 1st deg AV block, RBBB, LAFB.   Assessment/Plan   1. TIA  - Presents after an episode of right-sided weakness and  confusion, seems to be back to baseline at time of admission per his wife   - No acute findings on head CT  - Appreciate neurology consultation  - Continue cardiac monitoring and neuro checks; check MRI brain, CTA head and neck, A1c, and lipids; consult PT/OT/SLP; continue ASA and add Plavix    2. Dementia  - Continue Namenda and Aricept, use delirium precautions   3. CKD IIIb  - SCr is 1.68 on admission, similar to priors  - Renally-dose medications     DVT prophylaxis: sq heparin  Code Status: DNR, confirmed in ED  Level of Care: Level of care: Telemetry Medical Family Communication: wife at bedside  Disposition Plan:  Patient is from: home  Anticipated d/c is to: TBD Anticipated d/c date is: 3/9 or 3/10 Patient currently: pending echo, CTA, neuro checks, tele monitoring, therapy evals Consults called: neurology Admission status: Observation     Vianne Bulls, MD [facility]  01/13/2022, 7:45 PM

## 2022-01-13 NOTE — ED Notes (Signed)
Patient transported to CT 

## 2022-01-13 NOTE — Progress Notes (Signed)
?   01/13/22 1640  ?Clinical Encounter Type  ?Visited With Patient;Family  ?Visit Type Initial;Code  ?Referral From Other (Comment) ?(pager)  ?Spiritual Encounters  ?Spiritual Needs Emotional;Prayer  ? ?Chaplain provided support, mainly to wife of patient while patient was in CT scan. ?

## 2022-01-13 NOTE — Progress Notes (Signed)
CODE STROKE- PHARMACY COMMUNICATION ? ? ?Time CODE STROKE called/page received:1640 ? ?Time response to CODE STROKE was made (in person or via phone): 1650 ? ?Time Stroke Kit retrieved from [place] (only if needed):n/a  ? ?Name of Provider/Nurse contacted:  ? ?Past Medical History:  ?Diagnosis Date  ? Alzheimer disease ([place])   ? GERD (gastroesophageal reflux disease)   ? Hypertension   ? Urinary incontinence   ? ?Prior to Admission medications   ?Medication Sig Start Date End Date Taking? Authorizing Provider  ?aspirin 81 MG EC tablet Take by mouth.    [provider]  ?buPROPion (WELLBUTRIN XL) 300 MG 24 hr tablet Take by mouth. 10/30/21   [provider]  ?Cholecalciferol 25 MCG (1000 UT) capsule Take by mouth.    [provider]  ?Cranberry 400 MG CAPS Take by mouth.    [provider]  ?donepezil (ARICEPT) 10 MG tablet Take 1 tablet by mouth daily. 03/30/21   [provider]  ?memantine (NAMENDA) 10 MG tablet Take 1 tablet by mouth 2 (two) times daily. 10/14/21   [provider]  ?Mirabegron (MYRBETRIQ PO) Take by mouth.    [provider]  ?olmesartan (BENICAR) 40 MG tablet Take by mouth. 12/24/20   [provider]  ?Turmeric 500 MG CAPS Take by mouth. Turmeric/Ginger tablet 1 QD    [provider]  ? ? ?Noralee Space ,PharmD ?Clinical Pharmacist  ?01/13/2022  5:16 PM ?  ?

## 2022-01-13 NOTE — ED Notes (Signed)
Called code stroke to [place] per Charge RN  spoke to [place] ?

## 2022-01-13 NOTE — ED Notes (Signed)
Patient returned to room from CT. 

## 2022-01-13 NOTE — ED Notes (Signed)
Patient's external catheter removed; saturated linens removed, new linens placed, pt gown changed. Notified IP RN, pt's ETA to room is within 10 minutes. ?

## 2022-01-13 NOTE — ED Notes (Signed)
Back from CT. Wife at [facility]. No changes. Remains alert, NAD, calm, interactive. Verbalizes needing to go to the b/r. Unable to use urinal at this time, unable to hold it. Wet with urine. Preparing to draw blood, establish IV, EKG and neuro check. ?

## 2022-01-13 NOTE — ED Triage Notes (Addendum)
Pt comes pov with right sided weakness starting about an hour ago but unsure if it was present before that. Pt is poor historian and wife was upstairs in her office. Grips are equal but pt almost fell with the home health aid due to right sided weakness. Had an episode of not being able to talk well but is able to talk and answer questions at this time. States he has had episodes like this before. Pt taken to [facility] and RN Wille Glaser made aware.  ?

## 2022-01-14 ENCOUNTER — Observation Stay (HOSPITAL_BASED_OUTPATIENT_CLINIC_OR_DEPARTMENT_OTHER)
Admit: 2022-01-14 | Discharge: 2022-01-14 | Disposition: A | Discharge status: Home / Self Care | Payer: Medicare Other | Attending: Family Medicine | Admitting: Family Medicine

## 2022-01-14 DIAGNOSIS — R531 Weakness: Secondary | ICD-10-CM | POA: Diagnosis not present

## 2022-01-14 DIAGNOSIS — G459 Transient cerebral ischemic attack, unspecified: Secondary | ICD-10-CM

## 2022-01-14 LAB — CBC
HCT: 38.4 % — ABNORMAL LOW (ref 39.0–52.0)
Hemoglobin: 12.9 g/dL — ABNORMAL LOW (ref 13.0–17.0)
MCH: 27.7 pg (ref 26.0–34.0)
MCHC: 33.6 g/dL (ref 30.0–36.0)
MCV: 82.4 fL (ref 80.0–100.0)
Platelets: 195 10*3/uL (ref 150–400)
RBC: 4.66 MIL/uL (ref 4.22–5.81)
RDW: 13.2 % (ref 11.5–15.5)
WBC: 5.2 10*3/uL (ref 4.0–10.5)
nRBC: 0 % (ref 0.0–0.2)

## 2022-01-14 LAB — LIPID PANEL
Cholesterol: 214 mg/dL — ABNORMAL HIGH (ref 0–200)
HDL: 59 mg/dL (ref >40)
LDL Cholesterol: 143 mg/dL — ABNORMAL HIGH (ref 0–99)
Total CHOL/HDL Ratio: 3.6 RATIO
Triglycerides: 58 mg/dL (ref <150)
VLDL: 12 mg/dL (ref 0–40)

## 2022-01-14 LAB — TROPONIN I (HIGH SENSITIVITY): Troponin I (High Sensitivity): 24 ng/L — ABNORMAL HIGH (ref <18)

## 2022-01-14 LAB — ECHOCARDIOGRAM COMPLETE
AR max vel: 3.06 cm2
AV Area VTI: 2.97 cm2
AV Area mean vel: 2.75 cm2
AV Mean grad: 2 mmHg
AV Peak grad: 3.9 mmHg
Ao pk vel: 0.99 m/s
Area-P 1/2: 3.2 cm2
Height: 75 in
MV VTI: 2.85 cm2
P 1/2 time: 680 msec
S' Lateral: 3.6 cm
Weight: 3680 oz

## 2022-01-14 LAB — BASIC METABOLIC PANEL
Anion gap: 7 (ref 5–15)
BUN: 16 mg/dL (ref 8–23)
CO2: 23 mmol/L (ref 22–32)
Calcium: 8.5 mg/dL — ABNORMAL LOW (ref 8.9–10.3)
Chloride: 108 mmol/L (ref 98–111)
Creatinine, Ser: 1.69 mg/dL — ABNORMAL HIGH (ref 0.61–1.24)
GFR, Estimated: 40 mL/min — ABNORMAL LOW (ref >60)
Glucose, Bld: 99 mg/dL (ref 70–99)
Potassium: 4.1 mmol/L (ref 3.5–5.1)
Sodium: 138 mmol/L (ref 135–145)

## 2022-01-14 LAB — HEMOGLOBIN A1C
Hgb A1c MFr Bld: 5.8 % — ABNORMAL HIGH (ref 4.8–5.6)
Mean Plasma Glucose: 120 mg/dL

## 2022-01-14 MED ORDER — BUPROPION HCL ER (XL) 150 MG PO TB24
150.0000 mg | ORAL_TABLET | Freq: Two times a day (BID) | ORAL | Status: DC
Start: 2022-01-14 — End: 2022-01-14
  Administered 2022-01-14: 09:00:00 150 mg via ORAL
  Filled 2022-01-14: qty 1

## 2022-01-14 MED ORDER — ROSUVASTATIN CALCIUM 20 MG PO TABS: 20.0000 mg | ORAL_TABLET | Freq: Every day | ORAL | 11 refills | Status: DC

## 2022-01-14 NOTE — Evaluation (Signed)
Physical Therapy Evaluation Patient Details Name: Robert Zamora MRN: [id number] DOB: May 28, 1941 Today's Date: 01/14/2022  History of Present Illness  Patient is an 81 year old male who presented to [facility] ED yesterday 01/14/22 for right sided weakness. Patient has a PMH (+) for vascular dementia, GERD, HTN and urinary incontinence.   Clinical Impression  Physical Therapy Evaluation completed on this date. Upon entering room patient was laying in bed with [facility] raised and wife present. No pain reported throughout session. Per patient's wife, they live in a 1.5 level home (ranch style) with 1/4 step to enter. No hand rails present, however patients wife has placed grab bars by the steps, around the house, and within the bathrooms. Patient ambulates with an up walker or tall SPC/walking stick, however has better stability when ambulating with the up walker. Patient has an aide that homes to the house to help with IADLs. Per wife, patient requires Mod A for bathing and dressing due to patient's cognitive deficits. Patient has a history of working with HHPT, and demonstrated great gains, however as soon as patient was discharged, significant decline was noted.   Patient demonstrated good BUE strength (4/5 bilaterally) and strong grip strength. Generalized weakness noted in BLE strength at least 3+/5. Patient was able to demonstrate bed mobility (supine<>sitting) at SBA with increased time and effort and significant use of bed rails to complete transfer. Per wife, patient has a bed rail on the bed at home that he uses. To complete sit to stand, patient required cueing for proper hand placement, however was able to compete at SBA from elevated bed level. (Patient has an elevated bed at home). Patient required cueing for proper safety distance from RW when ambulating around the nurses, however was able to complete CGA/SBA with RW with mild unsteadiness and decreased speed. Patient was left supine in bed with wife present  and all needs met. Patient would continue to benefit from skilled physical therapy in order to optimize patient's safety and independence with performing activities of daily living. Recommend HHPT upon discharge.      Recommendations for follow up therapy are one component of a multi-disciplinary discharge planning process, led by the attending physician.  Recommendations may be updated based on patient status, additional functional criteria and insurance authorization.  Follow Up Recommendations Home health PT    Assistance Recommended at Discharge Frequent or constant Supervision/Assistance  Patient can return home with the following  A little help with walking and/or transfers;Assistance with cooking/housework;A little help with bathing/dressing/bathroom;Direct supervision/assist for financial management;Direct supervision/assist for medications management;Assist for transportation;Help with stairs or ramp for entrance    Equipment Recommendations None recommended by PT (at this time)  Recommendations for Other Services       Functional Status Assessment Patient has had a recent decline in their functional status and demonstrates the ability to make significant improvements in function in a reasonable and predictable amount of time.     Precautions / Restrictions Precautions Precautions: Fall Restrictions Weight Bearing Restrictions: No      Mobility  Bed Mobility Overal bed mobility: Needs Assistance Bed Mobility: Supine to Sit, Sit to Supine     Supine to sit: Supervision Sit to supine: Supervision   General bed mobility comments: increased time and effort to complete, significant use of bed rails to complete bed mobility    Transfers Overall transfer level: Needs assistance   Transfers: Sit to/from Stand Sit to Stand:  (SBA)  General transfer comment: cueing required for proper hand placement    Ambulation/Gait Ambulation/Gait assistance: Min guard  (SBA) Gait Distance (Feet): 185 Feet Assistive device: Rolling walker (2 wheels) Gait Pattern/deviations: Step-through pattern, Decreased step length - right, Decreased step length - left, Narrow base of support, Trunk flexed Gait velocity: decreased however per wife patient was ambulating faster than he normally does     General Gait Details: mild usnteadiness and slight hip flexion when walking, no LOB noted, cueing required for maintain safe distance to RW when ambulating  Stairs            Wheelchair Mobility    Modified Rankin (Stroke Patients Only)       Balance Overall balance assessment: Needs assistance Sitting-balance support: Bilateral upper extremity supported, Feet supported Sitting balance-Leahy Scale: Good     Standing balance support: Bilateral upper extremity supported, During functional activity, Reliant on assistive device for balance Standing balance-Leahy Scale: Fair                               Pertinent Vitals/Pain Pain Assessment Pain Assessment: No/denies pain    Home Living Family/patient expects to be discharged to:: Private residence Living Arrangements: Spouse/significant other Available Help at Discharge: Family Type of Home: House Home Access: Stairs to enter Entrance Stairs-Rails: Can reach both Entrance Stairs-Number of Steps: 1/4 step with grab bars to enter (no actual steps)- grab bars placed by wife to assist   Home Layout: One level (1.5 levels- range style home- office is on "second floor", patient lives on main floor) Home Equipment: Cane - single point;Grab bars - toilet;Grab bars - tub/shower;Shower seat;Wheelchair - manual [employer] walker, walking stick/cane, grab bars all over the house according to wife) Additional Comments: bed rail on bed at home    Prior Function Prior Level of Function : Needs assist       Physical Assist : Mobility (physical);ADLs (physical) Mobility (physical): Gait;Bed mobility ADLs  (physical): Grooming;Bathing;Dressing;Toileting Mobility Comments: per wife, patient puts all his weight on objects/ her for stability, patient utilizes his up walker for ambulation for stability, occasionally ambulates around the home through tactile cueing of the wall and furniture ADLs Comments: per patient's wife, patient requires Mod A with all IADLs, bathing, and dressing. Patient has an aide that comes to the house to assist with these activities. She states that she thinks due to patients cognitive decline, patient gets confused with articles of clothing that go over head, so has started using only clothes that zip up     Hand Dominance        Extremity/Trunk Assessment   Upper Extremity Assessment Upper Extremity Assessment: Overall WFL for tasks assessed (at least 4/5 strength bilaterally, good grip strength bilaterally)    Lower Extremity Assessment Lower Extremity Assessment: Generalized weakness (at least 3+/5 bilaterally)       Communication   Communication: No difficulties  Cognition Arousal/Alertness: Awake/alert Behavior During Therapy: WFL for tasks assessed/performed Overall Cognitive Status: History of cognitive impairments - at baseline                                 General Comments: A&Ox3- self, location, situation        General Comments General comments (skin integrity, edema, etc.): SpO2 remained >90% on room air, HR ranged from 55-100bpm throughout session    Exercises Other Exercises Other Exercises:  patient educated on role of PT in acute setting, fall risk, and d/c recommendations   Assessment/Plan    PT Assessment Patient needs continued PT services  PT Problem List Decreased strength;Decreased balance;Decreased cognition;Decreased mobility;Decreased activity tolerance;Decreased safety awareness;Decreased knowledge of use of DME;Decreased knowledge of precautions       PT Treatment Interventions DME instruction;Functional  mobility training;Balance training;Patient/family education;Gait training;Therapeutic activities;Therapeutic exercise;Stair training    PT Goals (Current goals can be found in the Care Plan section)  Acute Rehab PT Goals Patient Stated Goal: to go home PT Goal Formulation: With patient/family Time For Goal Achievement: 01/28/22 Potential to Achieve Goals: Good    Frequency Min 2X/week     Co-evaluation               AM-PAC PT "6 Clicks" Mobility  Outcome Measure Help needed turning from your back to your side while in a flat bed without using bedrails?: A Little Help needed moving from lying on your back to sitting on the side of a flat bed without using bedrails?: A Little Help needed moving to and from a bed to a chair (including a wheelchair)?: A Little Help needed standing up from a chair using your arms (e.g., wheelchair or bedside chair)?: A Little Help needed to walk in hospital room?: A Little Help needed climbing 3-5 steps with a railing? : A Lot 6 Click Score: 17    End of Session Equipment Utilized During Treatment: Gait belt Activity Tolerance: Patient tolerated treatment well Patient left: in bed;with nursing/sitter in room Nurse Communication: Mobility status PT Visit Diagnosis: Unsteadiness on feet (R26.81);Muscle weakness (generalized) (M62.81);Difficulty in walking, not elsewhere classified (R26.2)    Time: 0102-7253 PT Time Calculation (min) (ACUTE ONLY): 25 min   Charges:   PT Evaluation $PT Eval Low Complexity: 1 Low PT Treatments $Gait Training: 8-22 mins        Iva Boop, PT  01/14/22. 9:54 AM

## 2022-01-14 NOTE — Progress Notes (Signed)
SLP Cancellation Note ? ?Patient Details ?Name: Robert Zamora ?MRN: [id number] ?DOB: 1941/02/06 ? ? ?Cancelled treatment:       Reason Eval/Treat Not Completed: SLP screened, no needs identified, will sign off (chart reviewed; consulted NSG re: pt's status.) ?NSG reported pt was verbal w/ no overt speech or language issues during conversation. No swallowing issues w/ Pills, liquids per NSG. No indication of need for skilled services currently. ?Pt has discharged from the hospital at this time.  ? ? ? ?Orinda Kenner, MS, CCC-SLP ?Speech Language Pathologist ?Rehab Services; [place] ?[phone] (ascom) ?Kaisley Stiverson ?01/14/2022, 12:12 PM ?

## 2022-01-14 NOTE — Evaluation (Signed)
Occupational Therapy Evaluation ?Patient Details ?Name: Robert Zamora ?MRN: [id number] ?DOB: [birth year] ?Today's Date: 01/14/2022 ? ? ?History of Present Illness Patient is an 81 year old male who presented to [facility] ED yesterday 01/14/22 for right sided weakness. Patient has a PMH (+) for vascular dementia, GERD, HTN and urinary incontinence.  ? ?Clinical Impression ?  ?Robert Zamora presents with generalized weakness, limited endurance, fatigue, and impaired balance. He lives at home with his wife; has a home health attendant 4 hours/day, 5 days/week, with family planning to increase assistance to 7 days/wk. Pt reports having a number of "slips" at home, wife reports ongoing concerns re: pt's safety, stating he forgets to use AD, tries to hold on to unstable surfaces for support, etc. During today's evaluation, pt required increased time for performing all ADL, occasional cueing for safety and positioning, and contact guard-mod A for task performance. R LE weakness appears to have resolved at this time - 4/5 strength in LE, symmetrical bilaterally. Provided educ re: falls prevention, home modifications for safety. Recommend HHOT to work with pt on balance, strength, home modifications, safe transfers and ambulation.   ? ?Recommendations for follow up therapy are one component of a multi-disciplinary discharge planning process, led by the attending physician.  Recommendations may be updated based on patient status, additional functional criteria and insurance authorization.  ? ?Follow Up Recommendations ? Home health OT  ?  ?Assistance Recommended at Discharge Frequent or constant Supervision/Assistance  ?Patient can return home with the following A little help with walking and/or transfers;A little help with bathing/dressing/bathroom;Assistance with cooking/housework;Direct supervision/assist for medications management;Assist for transportation;Direct supervision/assist for financial management ? ?  ?Functional Status  Assessment ? Patient has had a recent decline in their functional status and demonstrates the ability to make significant improvements in function in a reasonable and predictable amount of time.  ?Equipment Recommendations ? None recommended by OT  ?  ?Recommendations for Other Services   ? ? ?  ?Precautions / Restrictions Precautions ?Precautions: Fall ?Restrictions ?Weight Bearing Restrictions: No  ? ?  ? ?Mobility Bed Mobility ?Overal bed mobility: Needs Assistance ?Bed Mobility: Supine to Sit, Sit to Supine ?  ?  ?Supine to sit: Supervision ?Sit to supine: Supervision ?  ?General bed mobility comments: increased time and effort to complete, significant use of bed rails to complete bed mobility, cueing to move towards [facility] ?  ? ?Transfers ?Overall transfer level: Needs assistance ?Equipment used: Rolling walker (2 wheels) ?Transfers: Sit to/from Stand ?Sit to Stand: Min guard ?  ?  ?  ?  ?  ?General transfer comment: cueing required for hand placement. Extra time and effort. ?  ? ?  ?Balance Overall balance assessment: Needs assistance ?Sitting-balance support: Bilateral upper extremity supported, Feet supported ?Sitting balance-Leahy Scale: Good ?  ?  ?Standing balance support: Bilateral upper extremity supported, During functional activity, Reliant on assistive device for balance ?Standing balance-Leahy Scale: Good ?  ?  ?  ?  ?  ?  ?  ?  ?  ?  ?  ?  ?   ? ?ADL either performed or assessed with clinical judgement  ? ?ADL Overall ADL's : Needs assistance/impaired ?Eating/Feeding: Independent;Set up ?  ?Grooming: Set up;Wash/dry face;Wash/dry hands;Supervision/safety ?  ?  ?  ?  ?  ?  ?  ?Lower Body Dressing: Moderate assistance ?Lower Body Dressing Details (indicate cue type and reason): donning/doffing socks ?  ?  ?  ?  ?  ?  ?  ?   ? ? ? ?  Vision   ?   ?   ?Perception   ?  ?Praxis   ?  ? ?Pertinent Vitals/Pain Pain Assessment ?Pain Assessment: No/denies pain  ? ? ? ?Hand Dominance   ?  ?Extremity/Trunk  Assessment Upper Extremity Assessment ?Upper Extremity Assessment: Overall WFL for tasks assessed ?  ?Lower Extremity Assessment ?Lower Extremity Assessment: Generalized weakness ?  ?Cervical / Trunk Assessment ?Cervical / Trunk Assessment: Normal ?  ?Communication Communication ?Communication: No difficulties ?  ?Cognition Arousal/Alertness: Awake/alert ?Behavior During Therapy: [facility] for tasks assessed/performed ?Overall Cognitive Status: History of cognitive impairments - at baseline ?  ?  ?  ?  ?  ?  ?  ?  ?  ?  ?  ?  ?  ?  ?  ?  ?General Comments: very pleasant, able to respond appropriately in conversation and to follow directions, with increased processing time. ?  ?  ?General Comments  SpO2 remained >90% on room air, HR ranged from 55-100bpm throughout session ? ?  ?Exercises Other Exercises ?Other Exercises: Educ re: falls prevention, safe use of DME, home modifications ?  ?Shoulder Instructions    ? ? ?Home Living Family/patient expects to be discharged to:: Private residence ?Living Arrangements: Spouse/significant other ?Available Help at Discharge: Family;Home health ?Type of Home: House ?Home Access: Stairs to enter ?Entrance Stairs-Number of Steps: 1/4 step with grab bars to enter (no actual steps)- grab bars placed by wife to assist ?Entrance Stairs-Rails: Can reach both ?Home Layout: One level ?  ?  ?Bathroom Shower/Tub: Walk-in shower ?  ?Bathroom Toilet: Handicapped height ?  ?  ?Home Equipment: Grab bars - toilet;Grab bars - tub/shower;Shower seat;Wheelchair - Publishing copy (2 wheels);Cane - quad ?  ?Additional Comments: bed rail on bed at home ?  ? ?  ?Prior Functioning/Environment Prior Level of Function : Needs assist ?  ?  ?  ?Physical Assist : Mobility (physical);ADLs (physical) ?Mobility (physical): Gait;Bed mobility ?ADLs (physical): Grooming;Bathing;Dressing;Toileting ?Mobility Comments: Pt uses both RW and quad cane for ambulation, just got a WC for home use. Has had several  "slips" where he ends up on the floor. ?ADLs Comments: Pt requires assistance with dressing, showering. Is finding bed mobility increasingly difficult. Able to get up and down from toilet himself, but needs assistance with hygiene. ?  ? ?  ?  ?OT Problem List: Decreased strength;Impaired balance (sitting and/or standing);Decreased activity tolerance;Decreased knowledge of use of DME or AE ?  ?   ?OT Treatment/Interventions:    ?  ?OT Goals(Current goals can be found in the care plan section) Acute Rehab OT Goals ?Patient Stated Goal: to be able to do more things ?OT Goal Formulation: With patient ?Time For Goal Achievement: 01/28/22 ?Potential to Achieve Goals: Good  ?OT Frequency:   ?  ? ?Co-evaluation   ?  ?  ?  ?  ? ?  ?AM-PAC OT "6 Clicks" Daily Activity     ?Outcome Measure Help from another person eating meals?: None ?Help from another person taking care of personal grooming?: A Little ?Help from another person toileting, which includes using toliet, bedpan, or urinal?: A Lot ?Help from another person bathing (including washing, rinsing, drying)?: A Lot ?Help from another person to put on and taking off regular upper body clothing?: A Little ?Help from another person to put on and taking off regular lower body clothing?: A Lot ?6 Click Score: 16 ?  ?End of Session Equipment Utilized During Treatment: Rolling walker (2 wheels) ?Nurse Communication: Mobility status ? ?  Activity Tolerance: Patient tolerated treatment well ?Patient left: in bed;with call bell/phone within reach;with bed alarm set;with family/visitor present;Other (comment) (MD in room) ? ?OT Visit Diagnosis: Unsteadiness on feet (R26.81);Muscle weakness (generalized) (M62.81)  ?              ?Time: 5749-3552 ?OT Time Calculation (min): 41 min ?Charges:  OT General Charges ?$OT Visit: 1 Visit ?OT Evaluation ?$OT Eval Moderate Complexity: 1 Mod ?OT Treatments ?$Self Care/Home Management : 23-37 mins ?Josiah Lobo, PhD, MS, OTR/L ?01/14/22, 10:19  AM ? ?

## 2022-01-14 NOTE — Discharge Summary (Signed)
Physician Discharge Summary   Patient: Robert Zamora MRN: [id number] DOB: 1941/11/02  Admit date:     01/13/2022  Discharge date: 01/14/22  Discharge Physician: Lorella Nimrod   PCP: Leonel Ramsay, MD   Recommendations at discharge:  Please follow-up for echocardiogram results Follow-up with your neurologist according to your schedule Follow-up with your primary care provider  Discharge Diagnoses: Principal Problem:   Right sided weakness Active Problems:   Mixed Alzheimer's and vascular dementia ([place])   Stage 3b chronic kidney disease (CKD) [facility] [place])  Hospital Course: : Robert Zamora is an 81 y.o. male with medical history significant for hypertension, CKD 3, dementia, and chronic venous insufficiency, who presented to the emergency department with right-sided weakness and confusion.  He is accompanied by his wife assists with the history.  The patient was reportedly in his usual state when he developed right leg weakness that approximately 3 PM while walking with his aide.  He appeared to be dragging the right leg, and also seem to have some confusion per report of family.  In the ED, patient reports feeling back to his normal self and his wife also notes that he seems to be back to baseline.  Patient was hemodynamically stable.  Labs without any significant abnormality.  EKG with sinus rhythm, first-degree AV nodal block, RBBB and LAFB which seems chronic.  Admitted for TIA work-up  CT head and MRI brain was without any acute infarct.  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 PT/OT recommended home health services which were ordered. Patient had an appointment with his neurologist later today and will be following up with them for further recommendations.  He will continue with rest of his home medications and follow-up with his providers.  Consultants: Neurology  Procedures performed:  None Disposition: Home health Diet recommendation:  Discharge Diet Orders (From admission, onward)     Start     Ordered   01/14/22 0000  Diet - low sodium heart healthy        01/14/22 1029           Cardiac diet DISCHARGE MEDICATION: Allergies as of 01/14/2022   No Known Allergies      Medication List     TAKE these medications    aspirin 81 MG EC tablet Take 81 mg by mouth daily.   buPROPion 150 MG 24 hr tablet Commonly known as: WELLBUTRIN XL Take 150 mg by mouth 2 (two) times daily.   Cholecalciferol 25 MCG (1000 UT) capsule Take 1,000 Units by mouth daily.   Cranberry 425 MG Caps Take 1 capsule by mouth daily.   donepezil 10 MG tablet Commonly known as: ARICEPT Take 10 mg by mouth at bedtime.   memantine 10 MG tablet Commonly known as: NAMENDA Take 10 mg by mouth 2 (two) times daily.   mirabegron ER 50 MG Tb24 tablet Commonly known as: MYRBETRIQ Take 50 mg by mouth daily.   olmesartan 40 MG tablet Commonly known as: BENICAR Take 40 mg by mouth daily.   rosuvastatin 20 MG tablet Commonly known as: Crestor Take 1 tablet (20 mg total) by mouth daily.        Follow-up Information     Leonel Ramsay, MD. Schedule an appointment as soon as possible for a visit in 1 week(s).   Specialty: Infectious Diseases Contact information: [address] [place] [address] [phone]  Discharge Exam: Filed Weights   01/14/22 0009  Weight: 104.3 kg   General.  Well-developed elderly man, in no acute distress. Pulmonary.  Lungs clear bilaterally, normal respiratory effort. CV.  Regular rate and rhythm, no JVD, rub or murmur. Abdomen.  Soft, nontender, nondistended, BS positive. CNS.  Alert and oriented .  No focal neurologic deficit. Extremities.  No edema, no cyanosis, pulses intact and symmetrical. Psychiatry.  Judgment and insight appears normal.   Condition at discharge: stable  The results of significant  diagnostics from this hospitalization (including imaging, microbiology, ancillary and laboratory) are listed below for reference.   Imaging Studies: MR BRAIN WO CONTRAST  Result Date: 01/13/2022 CLINICAL DATA:  Initial evaluation for neuro deficit, stroke suspected. EXAM: MRI HEAD WITHOUT CONTRAST TECHNIQUE: Multiplanar, multiecho pulse sequences of the brain and surrounding structures were obtained without intravenous contrast. COMPARISON:  Prior CT and CTA from earlier the same day. FINDINGS: Brain: Diffuse prominence of the CSF containing spaces compatible with generalized cerebral atrophy. Extensive patchy and confluent T2/FLAIR hyperintensity involving the periventricular and deep white matter both cerebral hemispheres as well as the pons, most like related chronic microvascular ischemic disease, fairly severe in appearance. No evidence for acute or subacute ischemia. Gray-white matter differentiation maintained. No areas of chronic cortical infarction. No acute intracranial hemorrhage. Single punctate chronic microhemorrhage noted within the left thalamus. No mass lesion, midline shift, or mass effect. Diffuse ventricular prominence somewhat out of proportion of cortical sulcation. While this finding may be related to central atrophy, a degree of NPH could be contributory. No extra-axial fluid collection. Pituitary gland suprasellar region normal. Midline structures intact. Vascular: Absent flow void within the hypoplastic left V4 segment, consistent with occlusion as seen on prior CTA. Major intracranial vascular flow voids are otherwise maintained. Skull and upper cervical spine: Craniocervical junction within normal limits. Bone marrow signal intensity normal. Subcentimeter T1/T2 hyperintense lesion noted at the left parieto-occipital calvarium, likely benign. No other focal marrow replacing lesions. No scalp soft tissue abnormality. Sinuses/Orbits: Prior ocular lens replacement on the right. Globes and  orbital soft tissues demonstrate no acute finding. Paranasal sinuses are largely clear. No mastoid effusion. Other: None. IMPRESSION: 1. No acute intracranial abnormality. 2. Age-related cerebral atrophy with severe chronic microvascular ischemic disease. 3. Diffuse ventricular prominence somewhat out of proportion to cortical sulcation. While this finding may be related to central atrophy, a degree of NPH may be contributory, and could be considered in the correct clinical setting. Electronically Signed   By: Jeannine Boga M.D.   On: 01/13/2022 23:21   CT HEAD CODE STROKE WO CONTRAST  Result Date: 01/13/2022 CLINICAL DATA:  Code stroke.  Right-sided weakness. EXAM: CT HEAD WITHOUT CONTRAST TECHNIQUE: Contiguous axial images were obtained from the base of the skull through the vertex without intravenous contrast. RADIATION DOSE REDUCTION: This exam was performed according to the departmental dose-optimization program which includes automated exposure control, adjustment of the mA and/or kV according to patient size and/or use of iterative reconstruction technique. COMPARISON:  Head CT 08/10/2021 and MRI 05/15/2021 FINDINGS: Brain: There is no evidence of an acute infarct, intracranial hemorrhage, mass, midline shift, or extra-axial fluid collection. Confluent hypodensities in the cerebral white matter bilaterally are unchanged and nonspecific but compatible with severe chronic small vessel ischemic disease. Ventriculomegaly is unchanged and may reflect central predominant cerebral atrophy although normal pressure hydrocephalus is not excluded in the appropriate clinical setting. Vascular: Calcified atherosclerosis at the skull base. No hyperdense vessel. Skull: No fracture or suspicious  osseous lesion. Sinuses/Orbits: Visualized paranasal sinuses and mastoid air cells are clear. Right cataract extraction. Other: None. ASPECTS [facility] Stroke Program Early CT Score) - Ganglionic level infarction (caudate,  lentiform nuclei, internal capsule, insula, M1-M3 cortex): 7 - Supraganglionic infarction (M4-M6 cortex): 3 Total score (0-10 with 10 being normal): 10 IMPRESSION: 1. No evidence of acute intracranial abnormality. ASPECTS of 10. 2. Severe chronic small vessel ischemic disease. These results were communicated to Dr. Cheral Marker at [place] pm on 01/13/2022 by text page via the [facility] messaging system. Electronically Signed   By: Logan Bores M.D.   On: 01/13/2022 17:03   VAS [place] LOWER EXTREMITY ARTERIAL DUPLEX  Result Date: 01/04/2022 LOWER EXTREMITY ARTERIAL DUPLEX STUDY Patient Name:  DEVONE TOUSLEY  Date of Exam:   12/24/2021 Medical Rec #: [id number]     Accession #:    [id number] Date of Birth: Aug 26, 1941      Patient Gender: M Patient Age:   21 years Exam Location:  [facility] Procedure:      VAS [place] LOWER EXTREMITY ARTERIAL DUPLEX Referring Phys: Hortencia Pilar --------------------------------------------------------------------------------  Indications: BLE leg pain/discoloration.  Current ABI: not done Performing Technologist: Blondell Reveal RT, RDMS, RVT  Examination Guidelines: A complete evaluation includes B-mode imaging, spectral Doppler, color Doppler, and power Doppler as needed of all accessible portions of each vessel. Bilateral testing is considered an integral part of a complete examination. Limited examinations for reoccurring indications may be performed as noted.  +-----------+--------+-----+--------+---------+------------+  RIGHT       PSV cm/s Ratio Stenosis Waveform  Comments      +-----------+--------+-----+--------+---------+------------+  CFA Mid     72                      triphasic               +-----------+--------+-----+--------+---------+------------+  SFA Mid     71                      triphasic               +-----------+--------+-----+--------+---------+------------+  POP Mid     58                      triphasic                +-----------+--------+-----+--------+---------+------------+  ATA Distal                                    not detected  +-----------+--------+-----+--------+---------+------------+  PTA Distal  73                      triphasic               +-----------+--------+-----+--------+---------+------------+  PERO Distal 54                      triphasic               +-----------+--------+-----+--------+---------+------------+  +-----------+--------+-----+--------+---------------------+------------+  LEFT        PSV cm/s Ratio Stenosis Waveform              Comments      +-----------+--------+-----+--------+---------------------+------------+  CFA Mid     84  biphasic                            +-----------+--------+-----+--------+---------------------+------------+  SFA Mid     86                      biphasic                            +-----------+--------+-----+--------+---------------------+------------+  POP Mid     42                      biphasic                            +-----------+--------+-----+--------+---------------------+------------+  ATA Distal                                                not detected  +-----------+--------+-----+--------+---------------------+------------+  PTA Distal  55                      hyperemic                           +-----------+--------+-----+--------+---------------------+------------+  PERO Distal 77                      multiphasic/hyperemic               +-----------+--------+-----+--------+---------------------+------------+  Summary: Right: Patent femoral-popliteal arterial system with no significant stenosis. 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 Left: Patent femoral-popliteal arterial system with no significant stenosis. 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  See table(s) above for measurements and observations. Electronically signed by Hortencia Pilar MD on 01/04/2022 at 1:00:29  PM.   Final    VAS [place] LOWER EXTREMITY VENOUS REFLUX  Result Date: 12/28/2021  Lower Venous Reflux Study Patient Name:  KALIQ LEGE  Date of Exam:   12/24/2021 Medical Rec #: [id number]     Accession #:    [id number] Date of Birth: Dec 16, 1940      Patient Gender: M Patient Age:   59 years Exam Location:  [facility] Procedure:      VAS [place] LOWER EXTREMITY VENOUS REFLUX Referring Phys: Hortencia Pilar --------------------------------------------------------------------------------  Indications: Pain, and Swelling.  Performing Technologist: Blondell Reveal RT, RDMS, RVT  Examination Guidelines: A complete evaluation includes B-mode imaging, spectral Doppler, color Doppler, and power Doppler as needed of all accessible portions of each vessel. Bilateral testing is considered an integral part of a complete examination. Limited examinations for reoccurring indications may be performed as noted. The reflux portion of the exam is performed with the patient in reverse Trendelenburg. Significant venous reflux is defined as >500 ms in the superficial venous system, and >1 second in the deep venous system.  Venous Reflux Times +--------------+---------+------+-----------+------------+--------+  RIGHT          Reflux No Reflux Reflux Time Diameter cms Comments                             Yes                                      +--------------+---------+------+-----------+------------+--------+  CFV                       yes    >1 second                         +--------------+---------+------+-----------+------------+--------+  FV mid                    yes    >1 second                         +--------------+---------+------+-----------+------------+--------+  Popliteal      no                                                  +--------------+---------+------+-----------+------------+--------+  GSV at [facility]     no                                                   +--------------+---------+------+-----------+------------+--------+  GSV prox thigh no                                                  +--------------+---------+------+-----------+------------+--------+  GSV at knee    no                                                  +--------------+---------+------+-----------+------------+--------+  SSV prox calf  no                                                  +--------------+---------+------+-----------+------------+--------+  +--------------+---------+------+-----------+------------+--------+  LEFT           Reflux No Reflux Reflux Time Diameter cms Comments                             Yes                                      +--------------+---------+------+-----------+------------+--------+  CFV                       yes    >1 second                         +--------------+---------+------+-----------+------------+--------+  FV mid                    yes    >1 second                         +--------------+---------+------+-----------+------------+--------+  Popliteal  yes    >1 second                         +--------------+---------+------+-----------+------------+--------+  GSV at [facility]     no                                                  +--------------+---------+------+-----------+------------+--------+  GSV prox thigh no                                                  +--------------+---------+------+-----------+------------+--------+  GSV at knee    no                                                  +--------------+---------+------+-----------+------------+--------+  SSV prox calf  no                                                  +--------------+---------+------+-----------+------------+--------+  Summary: Right: - No evidence of deep vein thrombosis seen in the right lower extremity, from the common femoral through the popliteal veins. - No evidence of superficial venous thrombosis in the right lower extremity. - No evidence of superficial  venous reflux seen in the right greater saphenous vein. - No evidence of superficial venous reflux seen in the right short saphenous vein. - Venous reflux is noted in the right common femoral vein. - Venous reflux is noted in the right femoral vein.  Left: - No evidence of deep vein thrombosis seen in the left lower extremity, from the common femoral through the popliteal veins. - No evidence of superficial venous thrombosis in the left lower extremity. - No evidence of superficial venous reflux seen in the left greater saphenous vein. - No evidence of superficial venous reflux seen in the left short saphenous vein. - Venous reflux is noted in the left common femoral vein. - Venous reflux is noted in the left femoral vein. - Venous reflux is noted in the left popliteal vein.  *See table(s) above for measurements and observations. Electronically signed by Hortencia Pilar MD on 12/28/2021 at 2:33:16 PM.    Final    CT ANGIO HEAD NECK W WO CM (CODE STROKE)  Result Date: 01/13/2022 CLINICAL DATA:  Initial evaluation for acute stroke. EXAM: CT ANGIOGRAPHY HEAD AND NECK TECHNIQUE: Multidetector CT imaging of the head and neck was performed using the standard protocol during bolus administration of intravenous contrast. Multiplanar CT image reconstructions and MIPs were obtained to evaluate the vascular anatomy. Carotid stenosis measurements (when applicable) are obtained utilizing NASCET criteria, using the distal internal carotid diameter as the denominator. RADIATION DOSE REDUCTION: This exam was performed according to the departmental dose-optimization program which includes automated exposure control, adjustment of the mA and/or kV according to patient size and/or use of iterative reconstruction technique. CONTRAST:  61m OMNIPAQUE IOHEXOL 350 MG/ML SOLN COMPARISON:  CT from earlier the same day. FINDINGS: CTA NECK FINDINGS Aortic  arch: Visualized aortic arch normal in caliber with normal branch pattern. Mild plaque  about the arch itself. No significant stenosis about the origin of the great vessels. Right carotid system: Right common and internal carotid arteries are somewhat tortuous but widely patent without stenosis or dissection. Left carotid system: Left common and internal carotid arteries are tortuous but widely patent without stenosis or dissection. Vertebral arteries: Left vertebral artery hypoplastic and arises directly from the aortic arch. The hypoplastic left vertebral arteries patent at its origin, but occludes at the mid-distal left V1 segment. Left vertebral artery remains occluded distally within the neck. Dominant right vertebral artery widely patent without stenosis or dissection. Skeleton: No worrisome lytic or blastic osseous lesions. Mild-to-moderate multilevel cervical spondylosis without significant spinal stenosis. Other neck: No other acute soft tissue abnormality within the neck. Upper chest: Few scattered subcentimeter calcified left hilar lymph nodes partially visualized. Calcified granuloma noted within the left upper lobe, consistent with prior granulomatous infection. Visualized upper chest demonstrates no acute finding. Review of the MIP images confirms the above findings CTA HEAD FINDINGS Anterior circulation: Both petrous segments widely patent. Mild atheromatous change carotid siphons without hemodynamically significant stenosis. A1 segments patent bilaterally. Normal anterior communicating artery complex. Atheromatous irregularity seen throughout the ACAs without high-grade stenosis. Moderate atheromatous change throughout both M1 segments with associated mild to moderate multifocal stenoses. Normal MCA bifurcations. No proximal MCA branch occlusion. Moderate atheromatous irregularity throughout the MCA branches bilaterally. Posterior circulation: Dominant right vertebral artery widely patent to the vertebrobasilar junction. Right PICA patent. 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 Left PICA not seen. Atheromatous irregularity within the basilar artery without high-grade stenosis. Superior cerebral arteries patent bilaterally. Both PCAs primarily supplied via the basilar. Focal moderate right P2 stenosis noted (series 9, image 56). Focal severe left P2 stenosis noted as well (series 9, image 56). Moderate atheromatous irregularity elsewhere throughout the PCAs, but no other high-grade stenosis. Venous sinuses: Grossly patent allowing for arterial timing of the contrast bolus. Anatomic variants: None significant.  No aneurysm. Review of the MIP images confirms the above findings IMPRESSION: 1. Negative CTA for emergent large vessel occlusion. 2. 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 Dominant right vertebral artery widely patent. 3. 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 4. Diffuse tortuosity of the major arterial vasculature of the neck, suggesting chronic underlying hypertension. Electronically Signed   By: Jeannine Boga M.D.   On: 01/13/2022 20:37    Microbiology: Results for orders placed or performed during the hospital encounter of 01/13/22  Resp Panel by RT-PCR (Flu A&B, Covid) Nasopharyngeal Swab     Status: None   Collection Time: 01/13/22  8:10 PM   Specimen: Nasopharyngeal Swab; Nasopharyngeal(NP) swabs in vial transport medium  Result Value Ref Range Status   SARS Coronavirus 2 by RT PCR NEGATIVE NEGATIVE Final    Comment: (NOTE) SARS-CoV-2 target nucleic acids are NOT DETECTED.  The SARS-CoV-2 RNA is generally detectable in upper respiratory specimens during the acute phase of infection. The lowest concentration of SARS-CoV-2 viral copies this assay can detect is 138  copies/mL. A negative result does not preclude SARS-Cov-2 infection and should not be used as the sole basis for treatment or other patient management decisions. A negative result may occur with  improper specimen collection/handling, submission of  specimen other than nasopharyngeal swab, presence of viral mutation(s) within the areas targeted by this assay, and inadequate number of viral copies(<138 copies/mL). A negative result must be combined with clinical observations, patient history, and epidemiological information. The expected result is Negative.  Fact Sheet for Patients:  EntrepreneurPulse.com.au  Fact Sheet for Healthcare Providers:  IncredibleEmployment.be  This test is no t yet approved or cleared by the [place] FDA and  has been authorized for detection and/or diagnosis of SARS-CoV-2 by FDA under an Emergency Use Authorization (EUA). This EUA will remain  in effect (meaning this test can be used) for the duration of the COVID-19 declaration under Section 564(b)(1) of the Act, 21 U.S.C.section 360bbb-3(b)(1), unless the authorization is terminated  or revoked sooner.       Influenza A by PCR NEGATIVE NEGATIVE Final   Influenza B by PCR NEGATIVE NEGATIVE Final    Comment: (NOTE) The Xpert Xpress SARS-CoV-2/FLU/RSV plus assay is intended as an aid in the diagnosis of influenza from Nasopharyngeal swab specimens and should not be used as a sole basis for treatment. Nasal washings and aspirates are unacceptable for Xpert Xpress SARS-CoV-2/FLU/RSV testing.  Fact Sheet for Patients: EntrepreneurPulse.com.au  Fact Sheet for Healthcare Providers: IncredibleEmployment.be  This test is not yet approved or cleared by the [place] FDA and has been authorized for detection and/or diagnosis of SARS-CoV-2 by FDA under an Emergency Use Authorization (EUA). This EUA will remain in effect (meaning  this test can be used) for the duration of the COVID-19 declaration under Section 564(b)(1) of the Act, 21 U.S.C. section 360bbb-3(b)(1), unless the authorization is terminated or revoked.  Performed at [facility], [address] [place], [place] [address]     Labs: CBC: Recent Labs  Lab 01/13/22 1738 01/14/22 0440  WBC 5.1 5.2  NEUTROABS 3.1  --   HGB 13.5 12.9*  HCT 40.6 38.4*  MCV 83.7 82.4  PLT 206 993   Basic Metabolic Panel: Recent Labs  Lab 01/13/22 1738 01/14/22 0440  NA 137 138  K 4.4 4.1  CL 105 108  CO2 24 23  GLUCOSE 95 99  BUN 16 16  CREATININE 1.68* 1.69*  CALCIUM 8.7* 8.5*   Liver Function Tests: Recent Labs  Lab 01/13/22 1738  AST 19  ALT 18  ALKPHOS 62  BILITOT 0.7  PROT 7.2  ALBUMIN 3.4*   CBG: Recent Labs  Lab 01/13/22 1754  GLUCAP 88    Discharge time spent: greater than 30 minutes.  This record has been created using Systems analyst. Errors have been sought and corrected,but may not always be located. Such creation errors do not reflect on the standard of care.   Signed: Lorella Nimrod, MD [facility] 01/14/2022

## 2022-01-14 NOTE — Progress Notes (Signed)
*  PRELIMINARY RESULTS* ?Echocardiogram ?2D Echocardiogram has been performed. ? ?Robert Zamora ?01/14/2022, 8:47 AM ?

## 2022-01-14 NOTE — Progress Notes (Signed)
Nutrition Brief Note ? ?Patient identified on the Malnutrition Screening Tool (MST) Report ? ?Wt Readings from Last 15 Encounters:  ?01/14/22 104.3 kg  ?12/28/21 103.9 kg  ?12/24/21 105.6 kg  ?12/22/21 105.7 kg  ?09/10/21 106.5 kg  ?09/09/21 106.6 kg  ?08/19/21 104.4 kg  ?08/14/21 104.8 kg  ?08/10/21 104.8 kg  ?04/03/21 104.8 kg  ?02/19/21 90.7 kg  ? ?Robert Zamora is an 81 y.o. male with medical history significant for hypertension, CKD 3, dementia, and chronic venous insufficiency, who presented to the emergency department with right-sided weakness and confusion.  He is accompanied by his wife assists with the history.  The patient was reportedly in his usual state when he developed right leg weakness that approximately 3 PM while walking with his aide.  He appeared to be dragging the right leg, and also seem to have some confusion per report of family.  In the ED, patient reports feeling back to his normal self and his wife also notes that he seems to be back to baseline ? ?Pt admitted with TIA.  ? ?Pt working with therapy at time of visit.  ? ?Wt has been stable over the past 6 months.  ? ?Nutrition-Focused physical exam completed. Findings are no fat depletion, no muscle depletion, and no edema.   ? ?RD will liberalize diet to regular to increase variety of meal selections.  ? ?Current diet order is Heart Healthy, patient is consuming approximately n/a% of meals at this time. Labs and medications reviewed.  ? ?No nutrition interventions warranted at this time. If nutrition issues arise, please consult RD.  ? ?Loistine Chance, RD, LDN, CDCES ?Registered Dietitian II ?Certified Diabetes Care and Education Specialist ?Please refer to [facility] for RD and/or RD on-call/weekend/after hours pager   ?

## 2022-01-14 NOTE — TOC Initial Note (Signed)
Transition of Care (TOC) - Initial/Assessment Note  ? ? ?Patient Details  ?Name: Robert Zamora ?MRN: [id number] ?Date of Birth: March 02, 1941 ? ?Transition of Care (TOC) CM/SW Contact:    ?Pete Pelt, RN ?Phone Number: ?01/14/2022, 11:12 AM ? ?Clinical Narrative:     Patient lives at home with spouse, who handles transportation and medications.  Spouse requested psych consult for depression for after discharge.  She states she will contact patient's pcp for this information. ? ?Patient currently has a personal care aide with [facility], who does not provide PT OT.  [facility], Meg notified and will contact patient and spouse. ? ?Spouse declines DME needs t this time.            ? ? ?Expected Discharge Plan: [place] ([facility] and personal care aide) ?  ? ? ?Patient Goals and CMS Choice ?  ?  ?Choice offered to / list presented to : NA ? ?Expected Discharge Plan and Services ?Expected Discharge Plan: [place] ([facility] and personal care aide) ?  ?Discharge Planning Services: CM Consult ?  ?Living arrangements for the past 2 months: [place] ?Expected Discharge Date: 01/14/22               ?DME Arranged:  (family sttes patient does not need DME) ?  ?  ?  ?  ?HH Arranged: PT, OT ?[place] Agency: [place] ?Date HH Agency Contacted: 01/14/22 ?  ?Representative spoke with at [place]: [place] ? ?Prior Living Arrangements/Services ?Living arrangements for the past 2 months: [place] ?Lives with:: Self, Spouse ?Patient language and need for interpreter reviewed:: Yes (No interpreter required) ?Do you feel safe going back to the place where you live?: Yes      ?Need for Family Participation in Patient Care: Yes (Comment) ?Care giver support system in place?: Yes (comment) ?Current home services: Homehealth aide ?Criminal Activity/Legal Involvement Pertinent to Current Situation/Hospitalization: No - Comment as needed ? ?Activities of Daily Living ?Home Assistive  Devices/Equipment: Cane (specify quad or straight), Walker (specify type), Grab bars in shower, Wheelchair, Hand-held shower hose ?ADL Screening (condition at time of admission) ?Patient's cognitive ability adequate to safely complete daily activities?: No ?Is the patient deaf or have difficulty hearing?: Yes ?Does the patient have difficulty seeing, even when wearing glasses/contacts?: Yes ?Does the patient have difficulty concentrating, remembering, or making decisions?: Yes ?Patient able to express need for assistance with ADLs?: No ?Does the patient have difficulty dressing or bathing?: Yes ?Independently performs ADLs?: No ?Communication: Independent ?Dressing (OT): Needs assistance ?Is this a change from baseline?: Pre-admission baseline ?Grooming: Needs assistance ?Is this a change from baseline?: Pre-admission baseline ?Feeding: Independent ?Bathing: Needs assistance ?Is this a change from baseline?: Pre-admission baseline ?Toileting: Needs assistance ?Is this a change from baseline?: Pre-admission baseline ?In/Out Bed: Needs assistance ?Is this a change from baseline?: Pre-admission baseline ?Walks in Home: Needs assistance, Independent with device (comment) ?Is this a change from baseline?: Pre-admission baseline ?Does the patient have difficulty walking or climbing stairs?: Yes ?Weakness of Legs: Both ?Weakness of Arms/Hands: Both ? ?Permission Sought/Granted ?Permission sought to share information with : Case Manager ?Permission granted to share information with : Yes, Verbal Permission Granted ?   ? Permission granted to share info w AGENCY: [facility] ?   ?   ? ?Emotional Assessment ?Appearance:: Appears stated age ?Attitude/Demeanor/Rapport: Gracious, Engaged ?Affect (typically observed): Pleasant, Appropriate ?Orientation: : Oriented to Self, Oriented to Place ?  Alcohol / Substance Use: Not Applicable ?Psych Involvement: No (comment) ? ?Admission diagnosis:  TIA (transient ischemic attack)  [G45.9] ?Right sided weakness [R53.1] ?Patient Active Problem List  ? Diagnosis Date Noted  ? Right sided weakness 01/13/2022  ? Stage 3b chronic kidney disease (CKD) ([place]) 01/13/2022  ? TIA (transient ischemic attack)   ? Lymphedema 12/26/2021  ? 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  ? Depression 12/24/2021  ? Mixed Alzheimer's and vascular dementia ([place]) 12/24/2021  ? Chronic venous insufficiency 12/24/2021  ? GERD (gastroesophageal reflux disease) 12/24/2021  ? Abnormal SPEP 08/19/2021  ? Benign prostatic hyperplasia with urinary frequency 04/14/2021  ? Bradykinesia 04/14/2021  ? HTN (hypertension), benign 04/14/2021  ? PAD (peripheral artery disease) ([place]) 04/14/2021  ? ?PCP:  Leonel Ramsay, MD ?Pharmacy:   ?[id number] [address], [address] ?[place] [address] ?Phone: [phone] Fax: [phone] ? ? ? ? ?Social Determinants of Health (SDOH) Interventions ?  ? ?Readmission Risk Interventions ?No flowsheet data found. ? ? ?

## 2022-01-19 ENCOUNTER — Other Ambulatory Visit: Payer: Self-pay

## 2022-01-19 ENCOUNTER — Ambulatory Visit (INDEPENDENT_AMBULATORY_CARE_PROVIDER_SITE_OTHER): Payer: Medicare Other | Admitting: Urology

## 2022-01-19 DIAGNOSIS — N3281 Overactive bladder: Secondary | ICD-10-CM | POA: Diagnosis not present

## 2022-01-19 MED ORDER — MIRABEGRON ER 50 MG PO TB24: 50.0000 mg | ORAL_TABLET | Freq: Every day | ORAL | 11 refills | Status: DC

## 2022-01-19 NOTE — Progress Notes (Signed)
Virtual Visit via Telephone Note ? ?I connected with Robert Zamora and his wife on 01/19/22 at  3:45 PM EDT by telephone and verified that I am speaking with the correct person using two identifiers. ?  ?Patient location: Home ?Provider location: [facility] ? ? ?I discussed the limitations, risks, security and privacy concerns of performing an evaluation and management service by telephone and the availability of in person appointments. We discussed the impact of the COVID-19 pandemic on the healthcare system, and the importance of social distancing and reducing patient and provider exposure. I also discussed with the patient that there may be a patient responsible charge related to this service. The patient expressed understanding and agreed to proceed. ? ?Reason for visit: ?OAB ? ?History of Present Illness: ?81 year old comorbid male with Alzheimer's dementia and multiple years of urgency, frequency, and incontinence requiring depends.  At our original visit in November 2022 PVR and PSA were normal, but urine culture ultimately grew a small amount of E. coli and he was treated with 2 weeks of Bactrim.  They originally felt like this may have improved the urinary symptoms somewhat, but he has had persistent incontinence without realization during the day and night.  He is not particularly bothered by his symptoms.  Urinalysis at her last visit in February was benign.  We opted for trial of Myrbetriq, and its difficult to tell if this has made a significant difference.  He has a home health aide who is also on the phone call today, and provides most of his care.  It sounds like he has incontinence without realization both day and night. ? ?I had a long conversation with the family that we need to have realistic expectations moving forward with his significant Alzheimer's/dementia, and that this is likely a significant driver of his urinary symptoms.  We discussed options including timed voiding,  trial of different OAB medication like Gemtesa, and even an external condom catheter.  He is not particularly interested in an external catheter or even the timed voiding, and would like to continue the Myrbetriq. ? ?Follow Up: ?Myrbetriq refilled ?RTC 1 year ?  ?I discussed the assessment and treatment plan with the patient. The patient was provided an opportunity to ask questions and all were answered. The patient agreed with the plan and demonstrated an understanding of the instructions. ?  ?The patient was advised to call back or seek an in-person evaluation if the symptoms worsen or if the condition fails to improve as anticipated. ? ?I provided 10 minutes of non-face-to-face time during this encounter. ? ? ?Billey Co, MD  ? ?

## 2022-03-10 ENCOUNTER — Inpatient Hospital Stay: Payer: Medicare Other | Attending: Oncology

## 2022-03-10 DIAGNOSIS — F0150 Vascular dementia without behavioral disturbance: Secondary | ICD-10-CM | POA: Diagnosis not present

## 2022-03-10 DIAGNOSIS — D472 Monoclonal gammopathy: Secondary | ICD-10-CM | POA: Insufficient documentation

## 2022-03-10 DIAGNOSIS — R700 Elevated erythrocyte sedimentation rate: Secondary | ICD-10-CM | POA: Diagnosis not present

## 2022-03-10 DIAGNOSIS — F0280 Dementia in other diseases classified elsewhere without behavioral disturbance: Secondary | ICD-10-CM | POA: Insufficient documentation

## 2022-03-10 DIAGNOSIS — R803 Bence Jones proteinuria: Secondary | ICD-10-CM | POA: Insufficient documentation

## 2022-03-10 DIAGNOSIS — R7982 Elevated C-reactive protein (CRP): Secondary | ICD-10-CM | POA: Diagnosis not present

## 2022-03-10 DIAGNOSIS — G309 Alzheimer's disease, unspecified: Secondary | ICD-10-CM | POA: Insufficient documentation

## 2022-03-10 LAB — CBC WITH DIFFERENTIAL/PLATELET
Abs Immature Granulocytes: 0.02 10*3/uL (ref 0.00–0.07)
Basophils Absolute: 0.1 10*3/uL (ref 0.0–0.1)
Basophils Relative: 1 %
Eosinophils Absolute: 0.2 10*3/uL (ref 0.0–0.5)
Eosinophils Relative: 4 %
HCT: 40.6 % (ref 39.0–52.0)
Hemoglobin: 13.6 g/dL (ref 13.0–17.0)
Immature Granulocytes: 0 %
Lymphocytes Relative: 26 %
Lymphs Abs: 1.5 10*3/uL (ref 0.7–4.0)
MCH: 27.9 pg (ref 26.0–34.0)
MCHC: 33.5 g/dL (ref 30.0–36.0)
MCV: 83.4 fL (ref 80.0–100.0)
Monocytes Absolute: 0.7 10*3/uL (ref 0.1–1.0)
Monocytes Relative: 12 %
Neutro Abs: 3.1 10*3/uL (ref 1.7–7.7)
Neutrophils Relative %: 57 %
Platelets: 197 10*3/uL (ref 150–400)
RBC: 4.87 MIL/uL (ref 4.22–5.81)
RDW: 13.8 % (ref 11.5–15.5)
WBC: 5.5 10*3/uL (ref 4.0–10.5)
nRBC: 0 % (ref 0.0–0.2)

## 2022-03-10 LAB — COMPREHENSIVE METABOLIC PANEL
ALT: 18 U/L (ref 0–44)
AST: 20 U/L (ref 15–41)
Albumin: 3.9 g/dL (ref 3.5–5.0)
Alkaline Phosphatase: 65 U/L (ref 38–126)
Anion gap: 6 (ref 5–15)
BUN: 24 mg/dL — ABNORMAL HIGH (ref 8–23)
CO2: 24 mmol/L (ref 22–32)
Calcium: 8.6 mg/dL — ABNORMAL LOW (ref 8.9–10.3)
Chloride: 104 mmol/L (ref 98–111)
Creatinine, Ser: 1.73 mg/dL — ABNORMAL HIGH (ref 0.61–1.24)
GFR, Estimated: 39 mL/min — ABNORMAL LOW (ref >60)
Glucose, Bld: 99 mg/dL (ref 70–99)
Potassium: 3.8 mmol/L (ref 3.5–5.1)
Sodium: 134 mmol/L — ABNORMAL LOW (ref 135–145)
Total Bilirubin: 0.9 mg/dL (ref 0.3–1.2)
Total Protein: 8 g/dL (ref 6.5–8.1)

## 2022-03-11 ENCOUNTER — Encounter: Payer: Self-pay | Admitting: Oncology

## 2022-03-11 LAB — KAPPA/LAMBDA LIGHT CHAINS
Kappa free light chain: 50.2 mg/L — ABNORMAL HIGH (ref 3.3–19.4)
Kappa, lambda light chain ratio: 0.6 (ref 0.26–1.65)
Lambda free light chains: 83.4 mg/L — ABNORMAL HIGH (ref 5.7–26.3)

## 2022-03-12 NOTE — Telephone Encounter (Signed)
He need to provide 24 hr urine, but looks like he will not be able to provide this due to incontinence. Please advise.  ?

## 2022-03-15 ENCOUNTER — Ambulatory Visit: Payer: Medicare Other | Admitting: Physician Assistant

## 2022-03-15 LAB — MULTIPLE MYELOMA PANEL, SERUM
Albumin SerPl Elph-Mcnc: 3.6 g/dL (ref 2.9–4.4)
Albumin/Glob SerPl: 1.1 (ref 0.7–1.7)
Alpha 1: 0.2 g/dL (ref 0.0–0.4)
Alpha2 Glob SerPl Elph-Mcnc: 0.9 g/dL (ref 0.4–1.0)
B-Globulin SerPl Elph-Mcnc: 1 g/dL (ref 0.7–1.3)
Gamma Glob SerPl Elph-Mcnc: 1.5 g/dL (ref 0.4–1.8)
Globulin, Total: 3.6 g/dL (ref 2.2–3.9)
IgA: 314 mg/dL (ref 61–437)
IgG (Immunoglobin G), Serum: 1538 mg/dL (ref 603–1613)
IgM (Immunoglobulin M), Srm: 47 mg/dL (ref 15–143)
Total Protein ELP: 7.2 g/dL (ref 6.0–8.5)

## 2022-03-17 ENCOUNTER — Inpatient Hospital Stay (HOSPITAL_BASED_OUTPATIENT_CLINIC_OR_DEPARTMENT_OTHER): Payer: Medicare Other | Admitting: Oncology

## 2022-03-17 ENCOUNTER — Encounter: Payer: Self-pay | Admitting: Oncology

## 2022-03-17 VITALS — BP 148/84 | HR 60 | Temp 97.1°F | Ht 75.0 in | Wt 235.0 lb

## 2022-03-17 DIAGNOSIS — D472 Monoclonal gammopathy: Secondary | ICD-10-CM

## 2022-03-17 DIAGNOSIS — R803 Bence Jones proteinuria: Secondary | ICD-10-CM | POA: Diagnosis not present

## 2022-03-18 NOTE — Progress Notes (Signed)
?Hematology/Oncology Progress note ?[phone] [phone] ?  ? ? ? ?Patient Care Team: ?Leonel Ramsay, MD as PCP - General (Infectious Diseases) ? ?REFERRING PROVIDER: ?Leonel Ramsay, MD ? ?CHIEF COMPLAINTS/REASON FOR VISIT:  ?Bence-Jones proteinuria ? ?HISTORY OF PRESENTING ILLNESS:  ?Robert Zamora is a 81 y.o. male who was seen in consultation at the request of Leonel Ramsay, MD for evaluation of abnormal SPEP results.  ? ?Patient established care with rheumatology Dr.Patel for evaluation of elevated ESR and CRP ?Work up includes SPEP showed no M spike,  elevated IgG level. ?Patient has chronic ongoing balancing issues, incontinence, daytime somnolence, confusion. He follows up with neurology and neurosurgery. He was diagnosed with mixed Alzheimer's and vascular dementia since 2019.  ?Wife is concerned that patient's symptoms are getting worse. ?He was recent admitted 07/27/2021 for a lumbar drain placement, CSF showed increased wbc,rbc, increased neutrophil counts, decreased lymphocytes.  ?Repeat lumbar puncture on 08/14/2021, cytology is negative for malignancy, increased wbc, rbc ?, normal protein, flowcytometry showed insufficient cells for analysis, culture showed no growth ? ?08/05/2021 CT head showed atrophy, chronic microvascular disease.  ? ?INTERVAL HISTORY ?Robert Zamora is a 81 y.o. male who has above history reviewed by me today presents for follow up visit to go over blood work that he has done for evaluation of abnormal protein electrophoresis. ?Patient was accompanied by his wife.  No new complaints. ?Due to the incontinence, patient is unable to collect 24-hour urine UPEP. ?Patient has appointment follow-up with urology. ? ? ? ?Review of Systems  ?Constitutional:  Positive for fatigue. Negative for appetite change, chills, fever and unexpected weight change.  ?HENT:   Negative for hearing loss and voice change.   ?Eyes:  Negative for eye problems and icterus.   ?Respiratory:  Negative for chest tightness, cough and shortness of breath.   ?Cardiovascular:  Negative for chest pain and leg swelling.  ?Gastrointestinal:  Negative for abdominal distention and abdominal pain.  ?Endocrine: Negative for hot flashes.  ?Genitourinary:  Positive for bladder incontinence. Negative for difficulty urinating, dysuria and frequency.   ?Musculoskeletal:  Negative for arthralgias.  ?Skin:  Negative for itching and rash.  ?Neurological:   ?     Balancing problem.  ?  ?Hematological:  Negative for adenopathy. Does not bruise/bleed easily.  ?Psychiatric/Behavioral:  Positive for confusion.   ? ? ? ?MEDICAL HISTORY:  ?Past Medical History:  ?Diagnosis Date  ? Alzheimer disease ([place])   ? GERD (gastroesophageal reflux disease)   ? Hypertension   ? Urinary incontinence   ? ? ?SURGICAL HISTORY: ?Past Surgical History:  ?Procedure Laterality Date  ? CIRCUMCISION    ? ? ?SOCIAL HISTORY: ?Social History  ? ?Socioeconomic History  ? Marital status: Married  ?  Spouse name: Not on file  ? Number of children: Not on file  ? Years of education: Not on file  ? Highest education level: Not on file  ?Occupational History  ? Not on file  ?Tobacco Use  ? Smoking status: Former  ? Smokeless tobacco: Never  ? Tobacco comments:  ?  Quit 1980's   ?Substance and Sexual Activity  ? Alcohol use: Not Currently  ? Drug use: Never  ? Sexual activity: Not Currently  ?Other Topics Concern  ? Not on file  ?Social History Narrative  ? Not on file  ? ?Social Determinants of Health  ? ?Financial Resource Strain: Not on file  ?Food Insecurity: Not on file  ?Transportation Needs: Not on file  ?Physical  Activity: Not on file  ?Stress: Not on file  ?Social Connections: Not on file  ?Intimate Partner Violence: Not on file  ? ? ?FAMILY HISTORY: ?Family History  ?Problem Relation Age of Onset  ? Stroke Mother   ? Stroke Father   ? Cancer Neg Hx   ? ? ?ALLERGIES:  has No Known Allergies. ? ?MEDICATIONS:  ?Current Outpatient  Medications  ?Medication Sig Dispense Refill  ? aspirin 81 MG EC tablet Take 81 mg by mouth daily.    ? buPROPion (WELLBUTRIN XL) 300 MG 24 hr tablet     ? Cholecalciferol 25 MCG (1000 UT) capsule Take 1,000 Units by mouth daily.    ? Cranberry 425 MG CAPS Take 1 capsule by mouth daily.    ? donepezil (ARICEPT) 10 MG tablet Take 10 mg by mouth at bedtime.    ? memantine (NAMENDA) 10 MG tablet Take 10 mg by mouth 2 (two) times daily.    ? olmesartan (BENICAR) 40 MG tablet Take 40 mg by mouth daily.    ? rosuvastatin (CRESTOR) 20 MG tablet Take 1 tablet (20 mg total) by mouth daily. 30 tablet 11  ? buPROPion (WELLBUTRIN XL) 150 MG 24 hr tablet Take 150 mg by mouth 2 (two) times daily.    ? mirabegron ER (MYRBETRIQ) 50 MG TB24 tablet Take 1 tablet (50 mg total) by mouth daily. 90 each 11  ? ?No current facility-administered medications for this visit.  ? ? ? ?PHYSICAL EXAMINATION: ?ECOG PERFORMANCE STATUS: 2 - Symptomatic, <50% confined to bed ?Vitals:  ? 03/17/22 1338  ?BP: (!) 148/84  ?Pulse: 60  ?Temp: (!) 97.1 ?F (36.2 ?C)  ? ?Filed Weights  ? 03/17/22 1338  ?Weight: 235 lb (106.6 kg)  ? ? ?Physical Exam ?Constitutional:   ?   General: He is not in acute distress. ?   Comments: He sits in the wheel chair  ?HENT:  ?   Head: Normocephalic and atraumatic.  ?Eyes:  ?   General: No scleral icterus. ?Cardiovascular:  ?   Rate and Rhythm: Normal rate and regular rhythm.  ?   Heart sounds: Normal heart sounds.  ?Pulmonary:  ?   Effort: Pulmonary effort is normal. No respiratory distress.  ?   Breath sounds: No wheezing.  ?Abdominal:  ?   General: Bowel sounds are normal. There is no distension.  ?   Palpations: Abdomen is soft.  ?Musculoskeletal:     ?   General: No deformity. Normal range of motion.  ?   Cervical back: Normal range of motion and neck supple.  ?Skin: ?   General: Skin is warm and dry.  ?   Findings: No erythema or rash.  ?Neurological:  ?   Mental Status: He is alert and oriented to person, place, and  time. Mental status is at baseline.  ?Psychiatric:     ?   Mood and Affect: Mood normal.  ? ? ? ?LABORATORY DATA:  ?I have reviewed the data as listed ?Lab Results  ?Component Value Date  ? WBC 5.5 03/10/2022  ? HGB 13.6 03/10/2022  ? HCT 40.6 03/10/2022  ? MCV 83.4 03/10/2022  ? PLT 197 03/10/2022  ? ?Recent Labs  ?  08/10/21 ?1802 01/13/22 ?1738 01/14/22 ?0440 03/10/22 ?1351  ?NA 137 137 138 134*  ?K 4.2 4.4 4.1 3.8  ?CL 107 105 108 104  ?CO2 24 24 23 24   ?GLUCOSE 107* 95 99 99  ?BUN 16 16 16  24*  ?CREATININE 1.86* 1.68*  1.69* 1.73*  ?CALCIUM 8.5* 8.7* 8.5* 8.6*  ?GFRNONAA 36* 40* 40* 39*  ?PROT 7.7 7.2  --  8.0  ?ALBUMIN 3.8 3.4*  --  3.9  ?AST 20 19  --  20  ?ALT 17 18  --  18  ?ALKPHOS 62 62  --  65  ?BILITOT 1.3* 0.7  --  0.9  ? ? ?Iron/TIBC/Ferritin/ %Sat ?No results found for: IRON, TIBC, FERRITIN, IRONPCTSAT  ? ? ?RADIOGRAPHIC STUDIES: ?I have personally reviewed the radiological images as listed and agreed with the findings in the report.  ?No results found.  ? ?ASSESSMENT & PLAN:  ?1. 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 ?#Light chain MGUS/Bence-Jones protein ?Labs reviewed and discussed with patient ?Serum electrophoresis showed no M protein. ?Light chain ratio is normal. ?24-hour UPEP previously showed Bence-Jones protein.  Currently not able to collect due to incontinence. ?Appreciate urology opinion.  Consider condom catheter to facilitate urine collection in the future   ?Continue observation.  Repeat labs and urine testing in 6 months. ? ?Chronic balancing problems/incontinence/dementia, I will defer to neurology and neurosurgeon for management.  ? ? ?Orders Placed This Encounter  ?Procedures  ? CBC with Differential/Platelet  ?  Standing Status:   Future  ?  Standing Expiration Date:   03/18/2023  ? Comprehensive metabolic panel  ?  Standing Status:   Future  ?  Standing Expiration Date:   03/18/2023  ? Multiple Myeloma Panel (SPEP&IFE  w/QIG)  ?  Standing Status:   Future  ?  Standing Expiration Date:   03/18/2023  ? Kappa/lambda light chains  ?  Standing Status:   Future  ?  Standing Expiration Date:   03/18/2023  ? UPEP/TP, 24-Hr Urine  ?  Standing

## 2022-03-19 ENCOUNTER — Encounter: Payer: Self-pay | Admitting: Oncology

## 2022-03-19 NOTE — Telephone Encounter (Signed)
Please advise 

## 2022-03-22 ENCOUNTER — Other Ambulatory Visit: Payer: Self-pay

## 2022-03-22 DIAGNOSIS — R803 Bence Jones proteinuria: Secondary | ICD-10-CM

## 2022-03-24 ENCOUNTER — Ambulatory Visit (INDEPENDENT_AMBULATORY_CARE_PROVIDER_SITE_OTHER): Payer: Medicare Other | Admitting: Physician Assistant

## 2022-03-24 ENCOUNTER — Other Ambulatory Visit: Payer: Self-pay | Admitting: Infectious Diseases

## 2022-03-24 ENCOUNTER — Encounter: Payer: Self-pay | Admitting: Physician Assistant

## 2022-03-24 VITALS — BP 147/79 | HR 59 | Ht 75.0 in | Wt 231.0 lb

## 2022-03-24 DIAGNOSIS — F0280 Dementia in other diseases classified elsewhere without behavioral disturbance: Secondary | ICD-10-CM

## 2022-03-24 DIAGNOSIS — R32 Unspecified urinary incontinence: Secondary | ICD-10-CM | POA: Diagnosis not present

## 2022-03-24 DIAGNOSIS — R262 Difficulty in walking, not elsewhere classified: Secondary | ICD-10-CM

## 2022-03-24 DIAGNOSIS — Z9181 History of falling: Secondary | ICD-10-CM

## 2022-03-24 DIAGNOSIS — N3281 Overactive bladder: Secondary | ICD-10-CM

## 2022-03-24 DIAGNOSIS — R29898 Other symptoms and signs involving the musculoskeletal system: Secondary | ICD-10-CM

## 2022-03-24 NOTE — Patient Instructions (Signed)
Condom Cath Patient Instruction Guide ? ?Applying the male external catheter ?  ? ?Use the sizing guide to ensure a proper fit of the Male external catheter ?Always measure for correct size ?(both circumference and length with ?appropriate measuring guide for specific product) ?Opening the male external catheter  ? ?Flip open the pack using your thumb nail to break the seal. ? ?remove catheter from package  ?Remove the catheter from the package and place it over the head of the penis. ?Leave a small space between the end of the penis and the narrow catheter outlet. ?Uncircumcised users should leave the foreskin in place over the head of the penis. ?male external catheter application  ? ?Pull the double grip strip to slowly unroll the catheter ?all the way up the length of the penis. The catheter should unroll smoothly and evenly. ?squeeze catheter to apply  ? ?Gently squeeze the catheter around the shaft of the penis (approx. 1 min.) ?to ensure a secure fit. To reduce the risk of skin irritation, allow the skin ?on the penis to breathe for short periods in between male external catheter changes. ? ?  ? ?Connecting the Urine Bag ?Connecting the [facility] to the Urine bag  ?Connect a urine collecting bag to the catheter by fully inserting the tubing connector into the external catheter outlet. Push together firmly for a secure connection. ?   ?Removal is easy and painless. The catheter can be removed by detaching the catheter from the urine bag connector and carefully rolling it off the penis. If you need to, use warm soapy water to help remove the catheter. It can then be disposed of in the trash, with general household waste. Finish by washing your hands. Change the male external catheter everyday and change the bag according to recommendations from your healthcare professional. ?   ?

## 2022-03-24 NOTE — Progress Notes (Signed)
Patient presented to clinic today for catheter demonstration for management of urinary incontinence. He is accompanied by his wife.  I demonstrated catheter sizing procedure including penile circumference and length as well as catheter application and removal and the use of leg and night drainage bags.  I sent the patient home with a bag of samples to try and counseled him to contact clinic with his preferred size and length so we may submit a supply order for him. He expressed understanding. ? ?[name], PA-C ?03/24/22 ?2:54 PM ? ?I spent 20 minutes on the day of the encounter to include pre-visit record review, face-to-face time with the patient, and post-visit ordering of tests.   ?

## 2022-03-25 ENCOUNTER — Ambulatory Visit
Admission: RE | Admit: 2022-03-25 | Discharge: 2022-03-25 | Disposition: A | Discharge status: Home / Self Care | Payer: Medicare Other | Source: Ambulatory Visit | Attending: Infectious Diseases | Admitting: Infectious Diseases

## 2022-03-25 ENCOUNTER — Ambulatory Visit (INDEPENDENT_AMBULATORY_CARE_PROVIDER_SITE_OTHER): Payer: Medicare Other | Admitting: Podiatry

## 2022-03-25 ENCOUNTER — Encounter: Payer: Self-pay | Admitting: Podiatry

## 2022-03-25 DIAGNOSIS — B351 Tinea unguium: Secondary | ICD-10-CM | POA: Diagnosis not present

## 2022-03-25 DIAGNOSIS — M79675 Pain in left toe(s): Secondary | ICD-10-CM

## 2022-03-25 DIAGNOSIS — R29898 Other symptoms and signs involving the musculoskeletal system: Secondary | ICD-10-CM

## 2022-03-25 DIAGNOSIS — R262 Difficulty in walking, not elsewhere classified: Secondary | ICD-10-CM

## 2022-03-25 DIAGNOSIS — M79674 Pain in right toe(s): Secondary | ICD-10-CM

## 2022-03-25 DIAGNOSIS — I739 Peripheral vascular disease, unspecified: Secondary | ICD-10-CM | POA: Diagnosis not present

## 2022-03-25 DIAGNOSIS — Z9181 History of falling: Secondary | ICD-10-CM

## 2022-03-25 DIAGNOSIS — I872 Venous insufficiency (chronic) (peripheral): Secondary | ICD-10-CM

## 2022-03-25 DIAGNOSIS — G309 Alzheimer's disease, unspecified: Secondary | ICD-10-CM

## 2022-03-25 IMAGING — CT CT HEAD W/O CM
2 series · 15 of 30 positions shown, 17 images · non-contrast
Comparison: MRI head [DATE]

CLINICAL DATA: Dementia.



[Series 2: head without · axial · non-contrast · 0.46mm/px · z∈[-176,-51]mm · 7 of 35 slices shown, 9 images]
[im 5/35  brain]
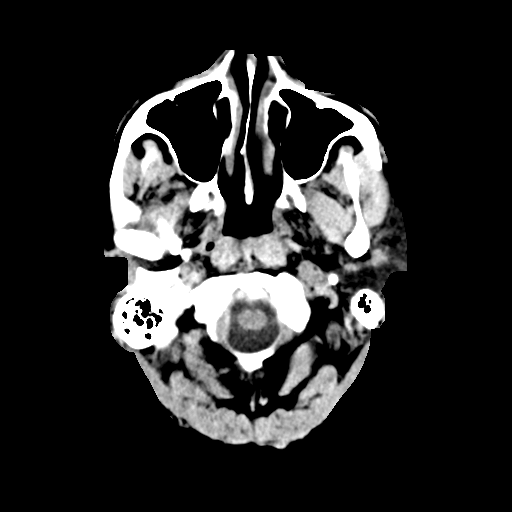
[im 5/35  bone]
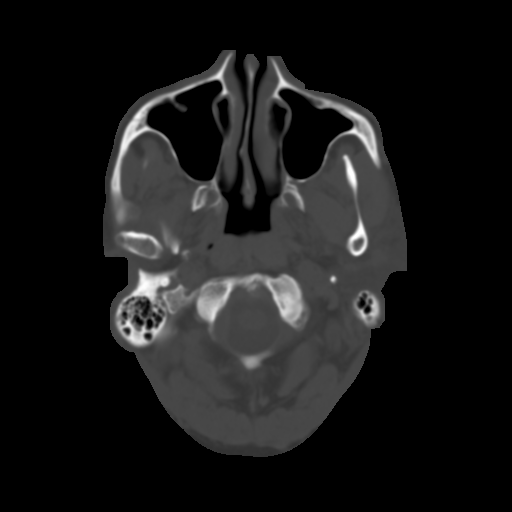
[im 9/35  brain]
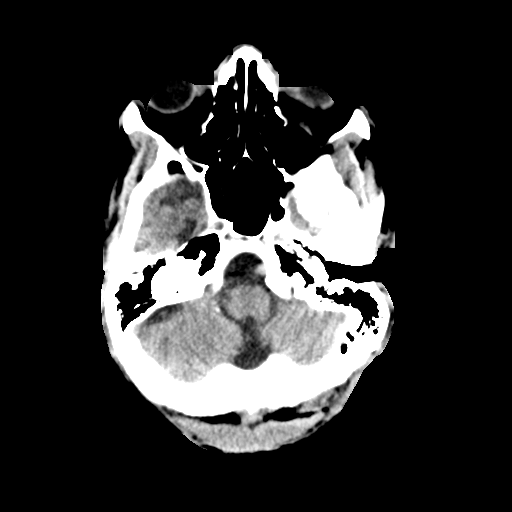
[im 13/35  brain]
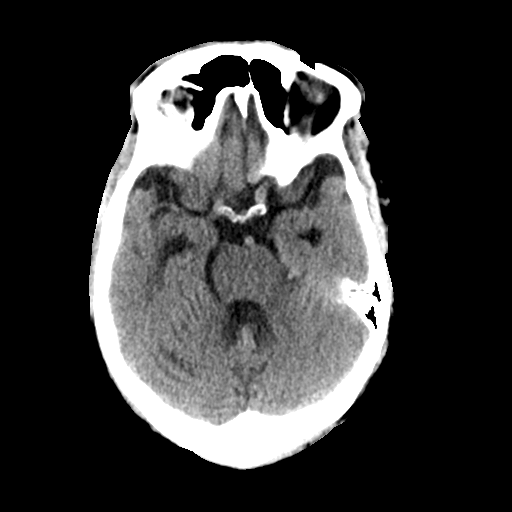
[im 18/35  brain]
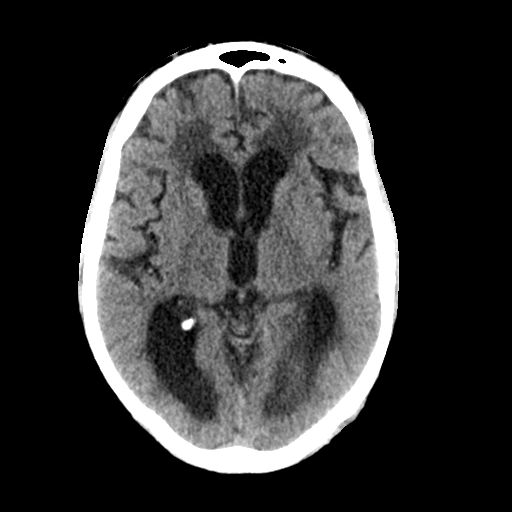
[im 22/35  brain]
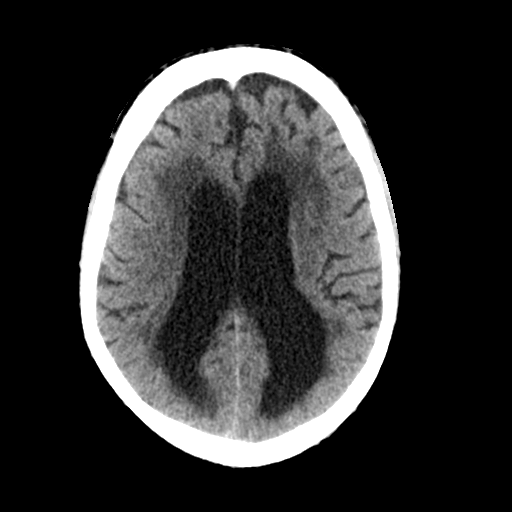
[im 22/35  bone]
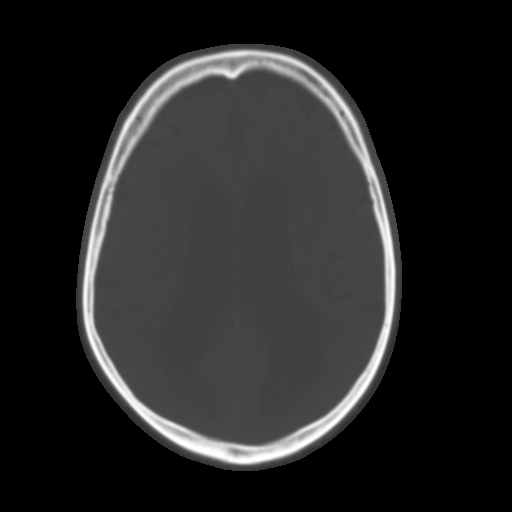
[im 26/35  brain]
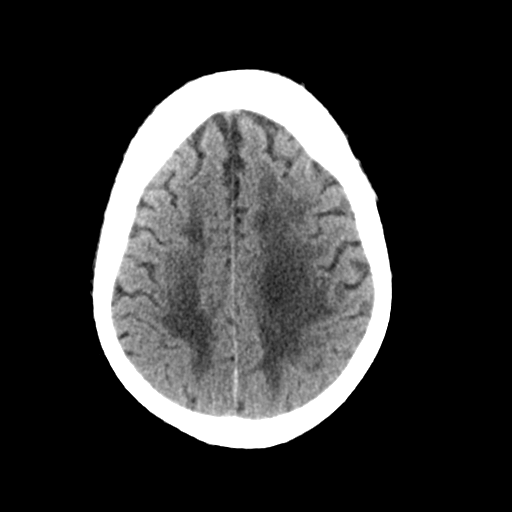
[im 30/35  brain]
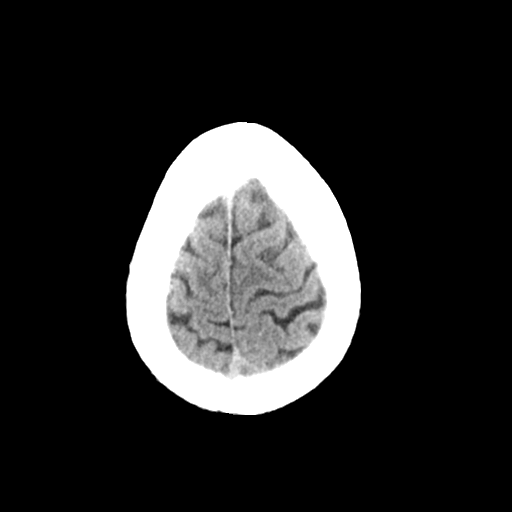

[Series 3: head bone · axial · 0.46mm/px · z∈[-180,-44]mm · 8 of 86 slices shown]
[im 9/86  bone]
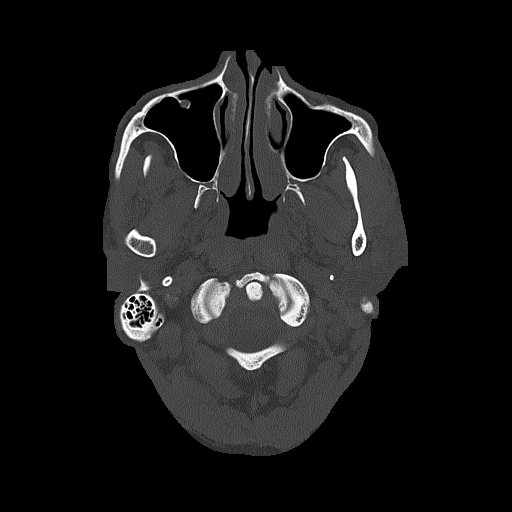
[im 18/86  bone]
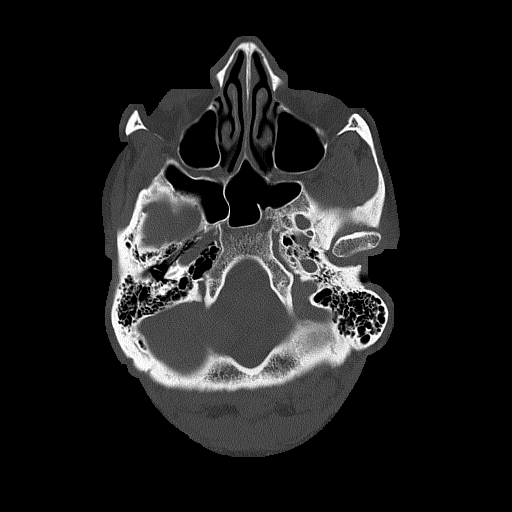
[im 26/86  bone]
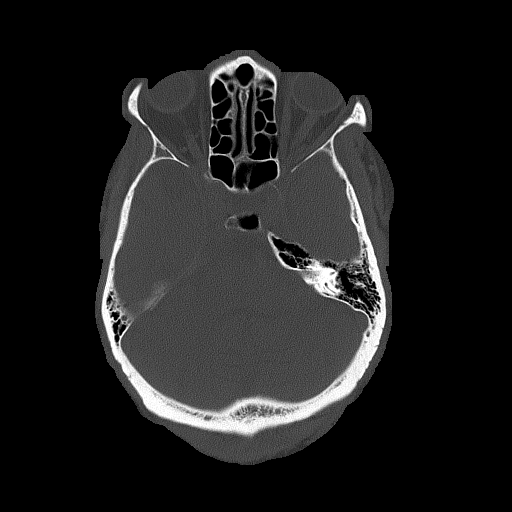
[im 39/86  bone]
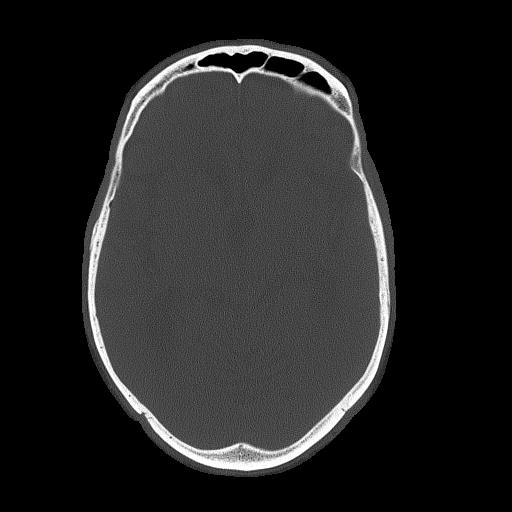
[im 47/86  bone]
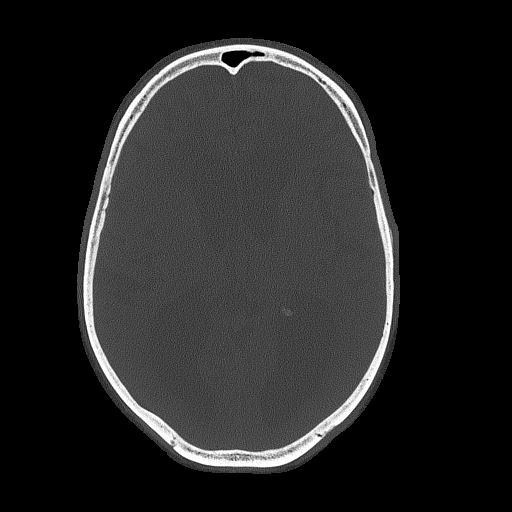
[im 60/86  bone]
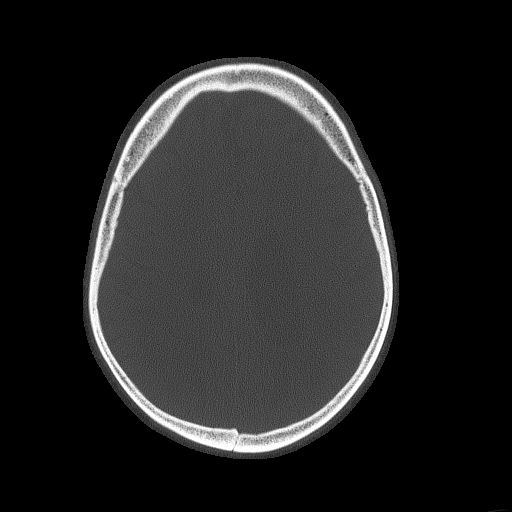
[im 69/86  bone]
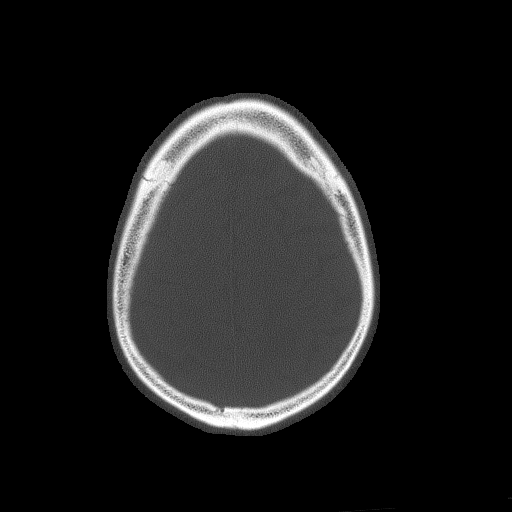
[im 77/86  bone]
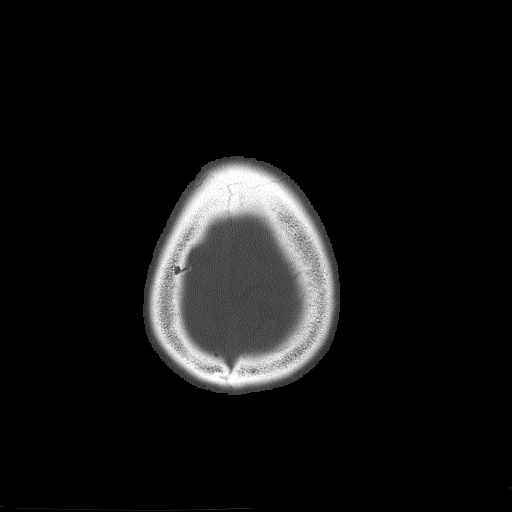

[15 of 30 positions shown; findings below may reference images not displayed]

FINDINGS: Brain: Generalized atrophy. Ventricular enlargement consistent with
atrophy is unchanged. Extensive white matter hypodensity bilaterally
similar to the prior MRI.

Negative for acute infarct, hemorrhage, mass

Vascular: Negative for hyperdense vessel

Skull: Negative

Sinuses/Orbits: Paranasal sinuses clear.  Negative orbit.

Other: None
IMPRESSION: No acute abnormality

Moderate atrophy and extensive chronic microvascular ischemic
change.

## 2022-03-25 NOTE — Progress Notes (Signed)
This patient returns to my office for at risk foot care.  This patient requires this care by a professional since this patient will be at risk due to having venous insufficiency and PAD.  This patient is unable to cut nails himself since the patient cannot reach his nails.These nails are painful walking and wearing shoes.  This patient presents for at risk foot care today.  General Appearance  Alert, conversant and in no acute stress.  Vascular  Dorsalis pedis and posterior tibial  pulses are weakly palpable  bilaterally.  Capillary return is within normal limits  bilaterally. Temperature is within normal limits  bilaterally.  Neurologic  Senn-Weinstein monofilament wire test within normal limits  bilaterally. Muscle power within normal limits bilaterally.  Nails Thick disfigured discolored nails with subungual debris  from hallux to fifth toes bilaterally. No evidence of bacterial infection or drainage bilaterally.  Orthopedic  No limitations of motion  feet .  No crepitus or effusions noted.  No bony pathology or digital deformities noted. HAV  B/L.  Skin  normotropic skin with no porokeratosis noted bilaterally.  No signs of infections or ulcers noted.  Healed hemorrhagic lesion under IPJ right foot.   Onychomycosis  Pain in right toes  Pain in left toes  Consent was obtained for treatment procedures.   Mechanical debridement of nails 1-5  bilaterally performed with a nail nipper.  Filed with dremel without incident.    Return office visit   3 months                  Told patient to return for periodic foot care and evaluation due to potential at risk complications.   Gardiner Barefoot DPM

## 2022-03-31 ENCOUNTER — Other Ambulatory Visit: Payer: Medicare Other

## 2022-03-31 NOTE — Progress Notes (Signed)
MRN : [id number]  Robert Zamora is a 81 y.o. ([birth year]) male who presents with chief complaint of legs swell.  History of Present Illness:   The patient returns to the office for followup evaluation regarding leg swelling.  The swelling has persisted and the pain associated with swelling continues. There have not been any interval development of a ulcerations or wounds.  Since the previous visit the patient has been wearing graduated compression stockings and has noted little if any improvement in the lymphedema. The patient has been using compression routinely morning until night.  The patient also states elevation during the day and exercise is being done too.   No outpatient medications have been marked as taking for the 04/01/22 encounter (Appointment) with Delana Meyer, Dolores Lory, MD.    Past Medical History:  Diagnosis Date   Alzheimer disease ([place])    GERD (gastroesophageal reflux disease)    Hypertension    Urinary incontinence     Past Surgical History:  Procedure Laterality Date   CIRCUMCISION      Social History Social History   Tobacco Use   Smoking status: Former    Passive exposure: Past   Smokeless tobacco: Never   Tobacco comments:    Quit 1980's   Substance Use Topics   Alcohol use: Not Currently   Drug use: Never    Family History Family History  Problem Relation Age of Onset   Stroke Mother    Stroke Father    Cancer Neg Hx     No Known Allergies   REVIEW OF SYSTEMS (Negative unless checked)  Constitutional: '[]'$ Weight loss  '[]'$ Fever  '[]'$ Chills Cardiac: '[]'$ Chest pain   '[]'$ Chest pressure   '[]'$ Palpitations   '[]'$ Shortness of breath when laying flat   '[]'$ Shortness of breath with exertion. Vascular:  '[]'$ Pain in legs with walking   '[x]'$ Pain in legs with standing  '[]'$ History of DVT   '[]'$ Phlebitis   '[x]'$ Swelling in legs   '[]'$ Varicose veins   '[]'$ Non-healing ulcers Pulmonary:   '[]'$ Uses home oxygen   '[]'$ Productive cough   '[]'$ Hemoptysis   '[]'$ Wheeze  '[]'$ COPD    '[]'$ Asthma Neurologic:  '[]'$ Dizziness   '[]'$ Seizures   '[]'$ History of stroke   '[x]'$ History of TIA  '[]'$ Aphasia   '[]'$ Vissual changes   '[]'$ Weakness or numbness in arm   '[]'$ Weakness or numbness in leg Musculoskeletal:   '[]'$ Joint swelling   '[]'$ Joint pain   '[]'$ Low back pain Hematologic:  '[]'$ Easy bruising  '[]'$ Easy bleeding   '[]'$ Hypercoagulable state   '[]'$ Anemic Gastrointestinal:  '[]'$ Diarrhea   '[]'$ Vomiting  '[x]'$ Gastroesophageal reflux/heartburn   '[]'$ Difficulty swallowing. Genitourinary:  '[x]'$ Chronic kidney disease   '[]'$ Difficult urination  '[]'$ Frequent urination   '[]'$ Blood in urine Skin:  '[]'$ Rashes   '[]'$ Ulcers  Psychological:  '[]'$ History of anxiety   '[]'$  History of major depression.  Physical Examination  There were no vitals filed for this visit. There is no height or weight on file to calculate BMI. Gen: WD/WN, NAD Head: [place]/AT, No temporalis wasting.  Ear/Nose/Throat: Hearing grossly intact, nares w/o erythema or drainage, pinna without lesions Eyes: PER, EOMI, sclera nonicteric.  Neck: Supple, no gross masses.  No JVD.  Pulmonary:  Good air movement, no audible wheezing, no use of accessory muscles.  Cardiac: RRR, precordium not hyperdynamic. Vascular:  scattered varicosities present bilaterally.  Mild venous stasis changes to the legs bilaterally.  3-4+ soft pitting edema  Vessel Right Left  Radial Palpable Palpable  Gastrointestinal: soft, non-distended. No guarding/no peritoneal signs.  Musculoskeletal: M/S 5/5 throughout.  No deformity.  Neurologic: CN 2-12 intact. Pain and light touch intact in extremities.  Symmetrical.  Speech is fluent. Motor exam as listed above. Psychiatric: Judgment intact, Mood & affect appropriate for pt's clinical situation. Dermatologic: Venous rashes no ulcers noted.  No changes consistent with cellulitis. Lymph : No lichenification or skin changes of chronic lymphedema.  CBC Lab Results  Component Value Date   WBC 5.5 03/10/2022   HGB 13.6 03/10/2022   HCT 40.6 03/10/2022   MCV 83.4  03/10/2022   PLT 197 03/10/2022    BMET    Component Value Date/Time   NA 134 (L) 03/10/2022 1351   K 3.8 03/10/2022 1351   CL 104 03/10/2022 1351   CO2 24 03/10/2022 1351   GLUCOSE 99 03/10/2022 1351   BUN 24 (H) 03/10/2022 1351   CREATININE 1.73 (H) 03/10/2022 1351   CALCIUM 8.6 (L) 03/10/2022 1351   GFRNONAA 39 (L) 03/10/2022 1351   CrCl cannot be calculated (Patient's most recent lab result is older than the maximum 21 days allowed.).  COAG Lab Results  Component Value Date   INR 1.0 01/13/2022   INR 1.1 08/10/2021    Radiology CT HEAD WO CONTRAST (5MM)  Result Date: 03/25/2022 CLINICAL DATA:  Dementia. EXAM: CT HEAD WITHOUT CONTRAST TECHNIQUE: Contiguous axial images were obtained from the base of the skull through the vertex without intravenous contrast. RADIATION DOSE REDUCTION: This exam was performed according to the departmental dose-optimization program which includes automated exposure control, adjustment of the mA and/or kV according to patient size and/or use of iterative reconstruction technique. COMPARISON:  MRI head 01/13/2022 FINDINGS: Brain: Generalized atrophy. Ventricular enlargement consistent with atrophy is unchanged. Extensive white matter hypodensity bilaterally similar to the prior MRI. Negative for acute infarct, hemorrhage, mass Vascular: Negative for hyperdense vessel Skull: Negative Sinuses/Orbits: Paranasal sinuses clear.  Negative orbit. Other: None IMPRESSION: No acute abnormality Moderate atrophy and extensive chronic microvascular ischemic change. Electronically Signed   By: Franchot Gallo M.D.   On: 03/25/2022 15:39     Assessment/Plan 1. Lymphedema Recommend:  No surgery or intervention at this point in time.    I have reviewed my discussion with the patient regarding lymphedema and why it  causes symptoms.  Patient will continue wearing graduated compression on a daily basis. The patient should put the compression on first thing in the  morning and removing them in the evening. The patient should not sleep in the compression.   In addition, behavioral modification throughout the day will be continued.  This will include frequent elevation (such as in a recliner), use of over the counter pain medications as needed and exercise such as walking.  The systemic causes for chronic edema such as liver, kidney and cardiac etiologies do not appear to have significant changed over the past year.    Despite conservative treatments including graduated compression therapy class 1 and behavioral modification including exercise and elevation the patient  has not obtained adequate control of the lymphedema.  The patient still has stage 3 lymphedema and therefore, I believe that a lymph pump should be added to improve the control of the patient's lymphedema.  Additionally, a lymph pump is warranted because it will reduce the risk of cellulitis and ulceration in the future.  Patient should follow-up in six months    2. Chronic venous insufficiency Recommend:  No surgery or intervention at this point in time.    I have reviewed my discussion with the patient regarding lymphedema and why it  causes  symptoms.  Patient will continue wearing graduated compression on a daily basis. The patient should put the compression on first thing in the morning and removing them in the evening. The patient should not sleep in the compression.   In addition, behavioral modification throughout the day will be continued.  This will include frequent elevation (such as in a recliner), use of over the counter pain medications as needed and exercise such as walking.  The systemic causes for chronic edema such as liver, kidney and cardiac etiologies do not appear to have significant changed over the past year.    Despite conservative treatments including graduated compression therapy class 1 and behavioral modification including exercise and elevation the patient  has  not obtained adequate control of the lymphedema.  The patient still has stage 3 lymphedema and therefore, I believe that a lymph pump should be added to improve the control of the patient's lymphedema.  Additionally, a lymph pump is warranted because it will reduce the risk of cellulitis and ulceration in the future.  Patient should follow-up in six months    3. Gastroesophageal reflux disease, unspecified whether esophagitis present Continue PPI as already ordered, this medication has been reviewed and there are no changes at this time.  Avoidence of caffeine and alcohol  Moderate elevation of the head of the bed    4. HTN (hypertension), benign Continue antihypertensive medications as already ordered, these medications have been reviewed and there are no changes at this time.   5. Stage 3b chronic kidney disease (CKD) ([place]) The patient has advanced renal disease.  However, at the present time the patient is not yet on dialysis.  Avoid nephrotoxic medications and dehydration.  Further plans per nephrology     Hortencia Pilar, MD  03/31/2022 8:59 PM

## 2022-04-01 ENCOUNTER — Encounter (INDEPENDENT_AMBULATORY_CARE_PROVIDER_SITE_OTHER): Payer: Self-pay | Admitting: Vascular Surgery

## 2022-04-01 ENCOUNTER — Ambulatory Visit (INDEPENDENT_AMBULATORY_CARE_PROVIDER_SITE_OTHER): Payer: Medicare Other | Admitting: Vascular Surgery

## 2022-04-01 VITALS — BP 154/88 | HR 63 | Resp 16 | Wt 229.4 lb

## 2022-04-01 DIAGNOSIS — I10 Essential (primary) hypertension: Secondary | ICD-10-CM

## 2022-04-01 DIAGNOSIS — K219 Gastro-esophageal reflux disease without esophagitis: Secondary | ICD-10-CM | POA: Diagnosis not present

## 2022-04-01 DIAGNOSIS — I872 Venous insufficiency (chronic) (peripheral): Secondary | ICD-10-CM | POA: Diagnosis not present

## 2022-04-01 DIAGNOSIS — N1832 Chronic kidney disease, stage 3b: Secondary | ICD-10-CM

## 2022-04-01 DIAGNOSIS — I890 Lymphedema, not elsewhere classified: Secondary | ICD-10-CM | POA: Diagnosis not present

## 2022-04-02 ENCOUNTER — Telehealth: Payer: Self-pay | Admitting: Internal Medicine

## 2022-04-02 NOTE — Telephone Encounter (Signed)
Patient is wanting to schedule a NPA with Dr. Rosana Berger. Please advise CB- [phone]

## 2022-04-03 ENCOUNTER — Encounter (INDEPENDENT_AMBULATORY_CARE_PROVIDER_SITE_OTHER): Payer: Self-pay | Admitting: Vascular Surgery

## 2022-04-07 ENCOUNTER — Ambulatory Visit: Payer: Medicare Other | Admitting: Urology

## 2022-04-08 ENCOUNTER — Ambulatory Visit: Payer: Medicare Other | Admitting: Urology

## 2022-04-12 ENCOUNTER — Telehealth: Payer: Self-pay | Admitting: Family Medicine

## 2022-04-12 NOTE — Telephone Encounter (Signed)
Robert Zamora from [facility] called and is requesting a verbal order for AFEX which is a system to be used to collect urine. It is simular to purewick but will take [employer]. She states the condom catheters are not working.  I gave the order to attempt to get this product.

## 2022-04-14 ENCOUNTER — Encounter: Payer: Self-pay | Admitting: Internal Medicine

## 2022-04-14 ENCOUNTER — Ambulatory Visit (INDEPENDENT_AMBULATORY_CARE_PROVIDER_SITE_OTHER): Payer: Medicare Other | Admitting: Internal Medicine

## 2022-04-14 VITALS — BP 142/80 | HR 86 | Temp 98.1°F | Resp 16 | Ht 75.0 in | Wt 230.1 lb

## 2022-04-14 DIAGNOSIS — F0150 Vascular dementia without behavioral disturbance: Secondary | ICD-10-CM

## 2022-04-14 DIAGNOSIS — E782 Mixed hyperlipidemia: Secondary | ICD-10-CM

## 2022-04-14 DIAGNOSIS — R778 Other specified abnormalities of plasma proteins: Secondary | ICD-10-CM

## 2022-04-14 DIAGNOSIS — I10 Essential (primary) hypertension: Secondary | ICD-10-CM | POA: Diagnosis not present

## 2022-04-14 DIAGNOSIS — G309 Alzheimer's disease, unspecified: Secondary | ICD-10-CM

## 2022-04-14 DIAGNOSIS — R7303 Prediabetes: Secondary | ICD-10-CM

## 2022-04-14 DIAGNOSIS — E559 Vitamin D deficiency, unspecified: Secondary | ICD-10-CM

## 2022-04-14 DIAGNOSIS — F332 Major depressive disorder, recurrent severe without psychotic features: Secondary | ICD-10-CM

## 2022-04-14 DIAGNOSIS — N3942 Incontinence without sensory awareness: Secondary | ICD-10-CM

## 2022-04-14 DIAGNOSIS — F0280 Dementia in other diseases classified elsewhere without behavioral disturbance: Secondary | ICD-10-CM

## 2022-04-14 MED ORDER — BUPROPION HCL ER (XL) 150 MG PO TB24: 150.0000 mg | ORAL_TABLET | Freq: Every day | ORAL | 1 refills | Status: DC

## 2022-04-14 NOTE — Patient Instructions (Addendum)
It was great seeing you today!  Plan discussed at today's visit: -Continue current medications -Wellbutrin increased to 450 mg daily  Follow up in: 6 weeks   Take care and let us know if you have any questions or concerns prior to your next visit.  Dr. Rosana Berger

## 2022-04-14 NOTE — Progress Notes (Signed)
New Patient Office Visit  Subjective    Patient ID: Robert Zamora, male    DOB: [birth year]  Age: 81 y.o. MRN: [id number]  CC:  Chief Complaint  Patient presents with   Establish Care   Urinary Incontinence    Wants to see about getting catheter/referral to uriology- possible uti foul odor but unable to give sample    HPI Robert Zamora presents to establish care.  Presents with his wife who provides some of the history.  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 Manuella Ghazi, last appointment 01/14/22.  -Currently on Namenda 10 mg, Aricept 10 mg  -For started experiencing balance issues, urinary incontinence and depression like symptoms since 2019 when he was diagnosed with mixed Alzheimer's and vascular dementia. -MRI brain from 4/22 showing ventriculomegaly that is nonspecific.  A CSF drain trial was performed, which did not change clinical symptoms. -He is planning on following with a neurologic medical research group in September -Symptoms are progressively getting worse.  He is primarily in a wheelchair.  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 Tasia Catchings, note reviewed from 03/17/2022 -Initially had elevation of ESR and CRP -SPEP without M spike but showing elevated IgG level. -Unable to collect a 24-hour urine UPEP secondary to urinary incontinence  Hypertension: -Medications: Olmesartan 40 mg  -Patient is compliant with above medications and reports no side effects. -Checking BP at home (average): 130/70-80  Hx of TIA: -About 5 TIA's total in the past - starting a few years ago when they were living in [place]  -Currently on Crestor 20 mg daily -Tolerating this medication well and denies side effects -Lipid Panel     Component Value Date/Time    CHOL 214 (H) 01/14/2022 0440   TRIG 58 01/14/2022 0440   HDL 59 01/14/2022 0440   CHOLHDL 3.6 01/14/2022 0440   VLDL 12 01/14/2022 0440   LDLCALC 143 (H) 01/14/2022 0440    Pre-Diabetes: -Last A1c 5.8% in March 2023  Vitamin D Deficiency: -Currently on Vitamin D 1000 IU   Recurrent UTI's/Urinary Retention:  -Saw Urology on 03/24/22 for urinary retention and condom catheter fitting, which is not working well for him now -Doesn't void for long periods of time but then will urinate excessively -Wife states that the patient cannot feel when he has to urinate and also cannot feel after he is urinated, so he will sit in soiled close until his wife or one of his aids notices -Unable to give a urine sample today.  MDD: -Mood status: uncontrolled -Current treatment: Wellbutrin XL 300 mg -Satisfied with current treatment?: no -Symptom severity: severe  -Side effects: no Medication compliance: excellent compliance     04/14/2022    8:24 AM  Depression screen PHQ 2/9  Decreased Interest 2  Down, Depressed, Hopeless 2  PHQ - 2 Score 4  Altered sleeping 3  Tired, decreased energy 3  Change in appetite 2  Feeling bad or failure about yourself  3  Trouble concentrating 3  Moving slowly or fidgety/restless 3  Suicidal thoughts 0  PHQ-9 Score 21  Difficult doing work/chores Very difficult     Outpatient Encounter Medications as of 04/14/2022  Medication Sig   aspirin 81 MG EC tablet Take 81 mg by mouth daily.   buPROPion [facility]  XL) 300 MG 24 hr tablet    Cholecalciferol 25 MCG (1000 UT) capsule Take 1,000 Units by mouth daily.   Cranberry 425 MG CAPS Take 1 capsule by mouth daily.   donepezil (ARICEPT) 10 MG tablet Take 10 mg by mouth at bedtime.   memantine (NAMENDA) 10 MG tablet Take 10 mg by mouth 2 (two) times daily.   olmesartan (BENICAR) 40 MG tablet Take 40 mg by mouth daily.   rosuvastatin (CRESTOR) 20 MG tablet Take 1 tablet (20 mg total) by mouth daily.   Turmeric  (QC TUMERIC COMPLEX PO) Take by mouth.   No facility-administered encounter medications on file as of 04/14/2022.    Past Medical History:  Diagnosis Date   Alzheimer disease (H[facility])    Cataract @2015    Depression 2015   Paid leave from work - This is an @ date   GERD (gastroesophageal reflux disease)    Hypertension    Leg weakness    TIA (transient ischemic attack)    Urinary incontinence     Past Surgical History:  Procedure Laterality Date   CIRCUMCISION     circumsized      Family History  Problem Relation Age of Onset   Stroke Mother    Stroke Father    Cancer Neg Hx     Social History   Socioeconomic History   Marital status: Married    Spouse name: Not on file   Number of children: Not on file   Years of education: Not on file   Highest education level: Not on file  Occupational History   Not on file  Tobacco Use   Smoking status: Former    Passive exposure: Past   Smokeless tobacco: Never   Tobacco comments:    Quit 1980's   Vaping Use   Vaping Use: Never used  Substance and Sexual Activity   Alcohol use: Never   Drug use: Never   Sexual activity: Not Currently  Other Topics Concern   Not on file  Social History Narrative   Not on file   Social Determinants of Health   Financial Resource Strain: Not on file  Food Insecurity: Not on file  Transportation Needs: Not on file  Physical Activity: Not on file  Stress: Not on file  Social Connections: Not on file  Intimate Partner Violence: Not on file    Review of Systems  Constitutional:  Negative for chills and fever.  Respiratory:  Negative for shortness of breath.   Cardiovascular:  Negative for chest pain.  Genitourinary:  Positive for frequency and urgency.  Neurological:  Positive for weakness.  Psychiatric/Behavioral:  Positive for depression.        Objective    BP (!) 142/80   Pulse 86   Temp 98.1 F (36.7 C)   Resp 16   Ht 6\' 3"  (1.905 m)   Wt 230 lb 1.6 oz (104.4 kg)    SpO2 97%   BMI 28.76 kg/m   Physical Exam Constitutional:      Appearance: Normal appearance.     Comments: Presents in wheelchair, will be sleepy but then will be alert when spoken to and will answer questions appropriately  HENT:     Head: Normocephalic and atraumatic.     Mouth/Throat:     Mouth: Mucous membranes are moist.     Pharynx: Oropharynx is clear.  Eyes:     Extraocular Movements: Extraocular movements intact.     Conjunctiva/sclera: Conjunctivae normal.  Pupils: Pupils are equal, round, and reactive to light.  Cardiovascular:     Rate and Rhythm: Normal rate and regular rhythm.  Pulmonary:     Effort: Pulmonary effort is normal.     Breath sounds: Normal breath sounds.  Abdominal:     General: There is no distension.     Palpations: Abdomen is soft.     Tenderness: There is no abdominal tenderness.  Musculoskeletal:     Right lower leg: No edema.     Left lower leg: No edema.  Skin:    General: Skin is warm and dry.  Neurological:     Mental Status: He is alert. Mental status is at baseline.  Psychiatric:        Mood and Affect: Mood normal.        Behavior: Behavior normal.        Assessment & Plan:   1. Mixed Alzheimer's and vascular dementia [facility]): Discussed how his symptoms are most likely progressive.  Patient and wife aware of this.  He is following with neurology, Dr. Manuella Ghazi.  Note reviewed from 01/14/2022.  Continue current regimen of Namenda 10 mg and Aricept 10 mg daily.  Recommend close neurologic follow-up for progression of disease.  Doing well with home health and home PT.  2. Abnormal SPEP: Following with heme-onc, Dr. Tasia Catchings, note reviewed from 03/17/2022.  Diagnosed with MGUS, however the patient is unable to provide urine sample for 24-hour urine collection for UPEP evaluation.  Working with urology to change catheters.  3. HTN (hypertension), benign: Chronic and stable.  Continue olmesartan 40 mg daily.  4. Mixed hyperlipidemia: History of  several TIAs, need to modify risk factors.  LDL high on last lipid panel.  At this point continue Crestor 20 mg daily, consider increasing as the patient can tolerate.  We will discuss more at follow-up.  5. Pre-diabetes: Last A1c 5.8 in 3/23.  Continue to monitor.  6. Vitamin D deficiency: Continue vitamin D supplements over-the-counter, at 1000 international units daily.  Plan to recheck vitamin D levels at follow-up.  7. Urinary incontinence without sensory awareness: Patient's wife stating that the condom catheter is not fitting well and it is not helpful for the patient's situation.  Wife asking for Afex male incontinence system, which I am not familiar with.  Recommend that the patient follow with urology for potential indwelling catheter, as it sounds like he has components of neurogenic incontinence.  - For home use only DME Other see comment  8. Severe episode of recurrent major depressive disorder, without psychotic features ([place]): Depression uncontrolled.  This is definitely a component of the dementia, however we will increase Wellbutrin to 450 mg daily.  Follow-up in 6 weeks for recheck.  - buPROPion (WELLBUTRIN XL) 150 MG 24 hr tablet; Take 1 tablet (150 mg total) by mouth daily.  Dispense: 90 tablet; Refill: 1  Return in about 6 weeks (around 05/26/2022).   Teodora Medici, DO

## 2022-04-15 ENCOUNTER — Ambulatory Visit: Payer: Medicare Other | Admitting: Internal Medicine

## 2022-04-16 ENCOUNTER — Encounter: Payer: Self-pay | Admitting: Internal Medicine

## 2022-04-16 DIAGNOSIS — Z9189 Other specified personal risk factors, not elsewhere classified: Secondary | ICD-10-CM

## 2022-04-16 DIAGNOSIS — F0150 Vascular dementia without behavioral disturbance: Secondary | ICD-10-CM

## 2022-04-16 DIAGNOSIS — R1310 Dysphagia, unspecified: Secondary | ICD-10-CM

## 2022-04-22 ENCOUNTER — Ambulatory Visit (INDEPENDENT_AMBULATORY_CARE_PROVIDER_SITE_OTHER): Payer: Medicare Other | Admitting: Physician Assistant

## 2022-04-22 ENCOUNTER — Encounter: Payer: Self-pay | Admitting: Physician Assistant

## 2022-04-22 ENCOUNTER — Ambulatory Visit: Payer: Self-pay

## 2022-04-22 ENCOUNTER — Telehealth: Payer: Self-pay | Admitting: Internal Medicine

## 2022-04-22 VITALS — BP 123/72 | HR 69 | Ht 75.0 in

## 2022-04-22 DIAGNOSIS — R3915 Urgency of urination: Secondary | ICD-10-CM

## 2022-04-22 DIAGNOSIS — R82998 Other abnormal findings in urine: Secondary | ICD-10-CM

## 2022-04-22 DIAGNOSIS — N3942 Incontinence without sensory awareness: Secondary | ICD-10-CM

## 2022-04-22 DIAGNOSIS — R8290 Unspecified abnormal findings in urine: Secondary | ICD-10-CM

## 2022-04-22 LAB — URINALYSIS, COMPLETE
Bilirubin, UA: NEGATIVE
Glucose, UA: NEGATIVE
Ketones, UA: NEGATIVE
Nitrite, UA: POSITIVE — AB
Specific Gravity, UA: 1.015 (ref 1.005–1.030)
Urobilinogen, Ur: 1 mg/dL (ref 0.2–1.0)
pH, UA: 5.5 (ref 5.0–7.5)

## 2022-04-22 LAB — MICROSCOPIC EXAMINATION: WBC, UA: >30 /hpf — AB (ref 0–5)

## 2022-04-22 MED ORDER — SULFAMETHOXAZOLE-TRIMETHOPRIM 800-160 MG PO TABS: 1.0000 | ORAL_TABLET | Freq: Two times a day (BID) | ORAL | 0 refills | Status: AC

## 2022-04-22 NOTE — Telephone Encounter (Signed)
Home Health Verbal Orders - Caller/Agency: [facility]  Callback Number: [phone] vm can be left  Requesting Extend PT Frequency: 1x a week for 9 weeks   He also wanted to alert DR. Rosana Berger that the pt had 2 falls over the weekend / no apparent injury but they did call EMS to help him stand after the 2nd fall / FYI

## 2022-04-22 NOTE — Progress Notes (Signed)
In and Out Catheterization  Patient is present today for a I & O catheterization due to UA. Patient was cleaned and prepped in a sterile fashion with betadine . A 14FR cath was inserted no complications were noted , 165m of urine return was noted, urine was cloudy in color. A clean urine sample was collected for UA sample. Bladder was drained  And catheter was removed with out difficulty.    Performed by: RVerlene Mayer [place]

## 2022-04-22 NOTE — Progress Notes (Signed)
04/22/2022 2:17 PM   Robert Zamora [birth year] [id number]  CC: Chief Complaint  Patient presents with   Urinary Tract Infection   HPI: Robert Zamora is a 81 y.o. male with PMH Alzheimer's dementia, urgency, frequency, and significant urinary leakage without awareness who presents today for evaluation of possible UTI.  He is accompanied today by his wife, who provides HPI.  Today she reports he has had ongoing dark, malodorous urine and worsened confusion and decreased ambulation over the past 3 days.  They are having him push fluids throughout the day and she notes that he will not void for a long time and then "the flood gates will open."  He denies dysuria, lower abdominal pain, low back pain, fever, chills, nausea, or vomiting today.  He is circumcised.  I saw him in clinic most recently on 03/24/2022 for condom catheter fitting.  She reports that these fell off and overflowed such that they are no longer using them.  She is interested in using the afex urinary management system, which her son found online, and she requests a prescription to get this reimbursed to her Medicare.  In-office catheterized UA today positive for trace intact blood, 2+ protein, nitrites, and 3+ leukocyte esterase; urine microscopy with >30 WBCs/HPF, 3-10 RBCs/HPF, and many bacteria.  PMH: Past Medical History:  Diagnosis Date   Alzheimer disease ([place])    Cataract '@2015'$    Depression 2015   Paid leave from work - This is an @ date   GERD (gastroesophageal reflux disease)    Hypertension    Leg weakness    TIA (transient ischemic attack)    Urinary incontinence     Surgical History: Past Surgical History:  Procedure Laterality Date   CIRCUMCISION     circumsized      Home Medications:  Allergies as of 04/22/2022   No Known Allergies      Medication List        Accurate as of April 22, 2022  2:17 PM. If you have any questions, ask your nurse or doctor.          aspirin EC 81 MG  tablet Take 81 mg by mouth daily.   buPROPion 300 MG 24 hr tablet Commonly known as: WELLBUTRIN XL   buPROPion 150 MG 24 hr tablet Commonly known as: WELLBUTRIN XL Take 1 tablet (150 mg total) by mouth daily.   Cholecalciferol 25 MCG (1000 UT) capsule Take 1,000 Units by mouth daily.   Cranberry 425 MG Caps Take 1 capsule by mouth daily.   donepezil 10 MG tablet Commonly known as: ARICEPT Take 10 mg by mouth at bedtime.   memantine 10 MG tablet Commonly known as: NAMENDA Take 10 mg by mouth 2 (two) times daily.   olmesartan 40 MG tablet Commonly known as: BENICAR Take 40 mg by mouth daily.   QC TUMERIC COMPLEX PO Take by mouth.   rosuvastatin 20 MG tablet Commonly known as: Crestor Take 1 tablet (20 mg total) by mouth daily.        Allergies:  No Known Allergies  Family History: Family History  Problem Relation Age of Onset   Stroke Mother    Stroke Father    Cancer Neg Hx     Social History:   reports that he has quit smoking. He has been exposed to tobacco smoke. He has never used smokeless tobacco. He reports that he does not drink alcohol and does not use drugs.  Physical Exam: BP 123/72  Pulse 69   Ht '6\' 3"'$  (1.905 m)   BMI 28.76 kg/m   Constitutional:  Alert and oriented, no acute distress, nontoxic appearing HEENT: [place], AT Cardiovascular: No clubbing, cyanosis, or edema Respiratory: Normal respiratory effort, no increased work of breathing Skin: No rashes, bruises or suspicious lesions Neurologic: Grossly intact, no focal deficits, moving all 4 extremities Psychiatric: Normal mood and affect  Laboratory Data: Results for orders placed or performed in visit on 04/22/22  Microscopic Examination   Urine  Result Value Ref Range   WBC, UA >30 (A) 0 - 5 /hpf   RBC 3-10 (A) 0 - 2 /hpf   Epithelial Cells (non renal) 0-10 0 - 10 /hpf   Bacteria, UA Many (A) None seen/Few  Urinalysis, Complete  Result Value Ref Range   Specific Gravity, UA  1.015 1.005 - 1.030   pH, UA 5.5 5.0 - 7.5   Color, UA Yellow Yellow   Appearance Ur Hazy (A) Clear   Leukocytes,UA 3+ (A) Negative   Protein,UA 2+ (A) Negative/Trace   Glucose, UA Negative Negative   Ketones, UA Negative Negative   RBC, UA Trace (A) Negative   Bilirubin, UA Negative Negative   Urobilinogen, Ur 1.0 0.2 - 1.0 mg/dL   Nitrite, UA Positive (A) Negative   Microscopic Examination See below:    Assessment & Plan:   1. Malodorous urine Patient denies irritative voiding symptoms today, however with reports of increased confusion over baseline and decreased ambulation times several days, it is possible that this represents UTI so I am going to treat him empirically with Bactrim and send his urine for culture today.  We discussed that if his confusion and ambulation remain worsened over baseline despite antibiotics, then UTI is not the cause of these.  We also discussed the very strong likelihood that Robert Zamora is chronically colonized with bacteria and that the mere presence of urine in the absence of infection symptoms does not warrant treatment in the future. - Urinalysis, Complete - CULTURE, URINE COMPREHENSIVE - sulfamethoxazole-trimethoprim (BACTRIM DS) 800-160 MG tablet; Take 1 tablet by mouth 2 (two) times daily for 7 days.  Dispense: 14 tablet; Refill: 0  2. Urinary incontinence without sensory awareness Failed condom catheters.  Dementia is likely contributory.  Okay to try afex system per patient request.  I found a company out of [place], [employer], that provided a letter of medical necessity form and that helps patients pursue reimbursement through their Medicare.  I provided them with the patient's contact information to help coordinate this.  I have the form available in clinic for patient's wife to pick up at her convenience.  Return if symptoms worsen or fail to improve.  [name], PA-C  [facility] [address], [place], [place] [address] [phone]

## 2022-04-22 NOTE — Telephone Encounter (Signed)
    Chief Complaint: PT reported pt. Fell over the weekend. Called wife and she reports pt. Was not injured.Was trying to get out of bed. Symptoms: None Frequency: Weekend Pertinent Negatives: Patient denies injury Disposition: '[]'$ ED /'[]'$ Urgent Care (no appt availability in office) / '[]'$ Appointment(In office/virtual)/ '[]'$  [facility] [place] [facility]/ '[]'$ Home Care/ '[]'$ Refused Recommended Disposition /'[]'$ [place] [facility]/ '[x]'$  Follow-up with PCP Additional Notes:   Answer Assessment - Initial Assessment Questions 1. MECHANISM: "How did the fall happen?"     Trying to get out of bed 2. DOMESTIC VIOLENCE AND ELDER ABUSE SCREENING: "Did you fall because someone pushed you or tried to hurt you?" If Yes, ask: "Are you safe now?"     No 3. ONSET: "When did the fall happen?" (e.g., minutes, hours, or days ago)     The weekend 4. LOCATION: "What part of the body hit the ground?" (e.g., back, buttocks, head, hips, knees, hands, head, stomach)     Bottom 5. INJURY: "Did you hurt (injure) yourself when you fell?" If Yes, ask: "What did you injure? Tell me more about this?" (e.g., body area; type of injury; pain severity)"     No per wife 6. PAIN: "Is there any pain?" If Yes, ask: "How bad is the pain?" (e.g., Scale 1-10; or mild,  moderate, severe)   - NONE (0): No pain   - MILD (1-3): Doesn't interfere with normal activities    - MODERATE (4-7): Interferes with normal activities or awakens from sleep    - SEVERE (8-10): Excruciating pain, unable to do any normal activities      No 7. SIZE: For cuts, bruises, or swelling, ask: "How large is it?" (e.g., inches or centimeters)      N/a 8. PREGNANCY: "Is there any chance you are pregnant?" "When was your last menstrual period?"     N/a 9. OTHER SYMPTOMS: "Do you have any other symptoms?" (e.g., dizziness, fever, weakness; new onset or worsening).      No 10. CAUSE: "What do you think caused the fall (or falling)?" (e.g., tripped, dizzy spell)       Right  leg "gave way."  Protocols used: Falls and [facility]

## 2022-04-23 ENCOUNTER — Emergency Department: Payer: Medicare Other

## 2022-04-23 ENCOUNTER — Emergency Department
Admission: EM | Admit: 2022-04-23 | Discharge: 2022-04-23 | Disposition: A | Discharge status: Home / Self Care | Payer: Medicare Other | Attending: Emergency Medicine | Admitting: Emergency Medicine

## 2022-04-23 ENCOUNTER — Other Ambulatory Visit: Payer: Self-pay

## 2022-04-23 DIAGNOSIS — W1839XA Other fall on same level, initial encounter: Secondary | ICD-10-CM | POA: Diagnosis not present

## 2022-04-23 DIAGNOSIS — Z20822 Contact with and (suspected) exposure to covid-19: Secondary | ICD-10-CM | POA: Diagnosis not present

## 2022-04-23 DIAGNOSIS — R296 Repeated falls: Secondary | ICD-10-CM | POA: Insufficient documentation

## 2022-04-23 DIAGNOSIS — Y92009 Unspecified place in unspecified non-institutional (private) residence as the place of occurrence of the external cause: Secondary | ICD-10-CM | POA: Insufficient documentation

## 2022-04-23 DIAGNOSIS — F0390 Unspecified dementia without behavioral disturbance: Secondary | ICD-10-CM | POA: Insufficient documentation

## 2022-04-23 DIAGNOSIS — R55 Syncope and collapse: Secondary | ICD-10-CM | POA: Diagnosis present

## 2022-04-23 DIAGNOSIS — W19XXXA Unspecified fall, initial encounter: Secondary | ICD-10-CM

## 2022-04-23 LAB — CBC WITH DIFFERENTIAL/PLATELET
Abs Immature Granulocytes: 0.04 10*3/uL (ref 0.00–0.07)
Basophils Absolute: 0.1 10*3/uL (ref 0.0–0.1)
Basophils Relative: 1 %
Eosinophils Absolute: 0 10*3/uL (ref 0.0–0.5)
Eosinophils Relative: 0 %
HCT: 42.1 % (ref 39.0–52.0)
Hemoglobin: 14 g/dL (ref 13.0–17.0)
Immature Granulocytes: 0 %
Lymphocytes Relative: 7 %
Lymphs Abs: 0.7 10*3/uL (ref 0.7–4.0)
MCH: 27.4 pg (ref 26.0–34.0)
MCHC: 33.3 g/dL (ref 30.0–36.0)
MCV: 82.4 fL (ref 80.0–100.0)
Monocytes Absolute: 1 10*3/uL (ref 0.1–1.0)
Monocytes Relative: 9 %
Neutro Abs: 8.9 10*3/uL — ABNORMAL HIGH (ref 1.7–7.7)
Neutrophils Relative %: 83 %
Platelets: 211 10*3/uL (ref 150–400)
RBC: 5.11 MIL/uL (ref 4.22–5.81)
RDW: 13.9 % (ref 11.5–15.5)
WBC: 10.7 10*3/uL — ABNORMAL HIGH (ref 4.0–10.5)
nRBC: 0 % (ref 0.0–0.2)

## 2022-04-23 LAB — URINALYSIS, ROUTINE W REFLEX MICROSCOPIC
Bilirubin Urine: NEGATIVE
Glucose, UA: NEGATIVE mg/dL
Ketones, ur: NEGATIVE mg/dL
Nitrite: POSITIVE — AB
Protein, ur: 100 mg/dL — AB
Specific Gravity, Urine: 1.01 (ref 1.005–1.030)
WBC, UA: >50 WBC/hpf — ABNORMAL HIGH (ref 0–5)
pH: 6 (ref 5.0–8.0)

## 2022-04-23 LAB — RESP PANEL BY RT-PCR (FLU A&B, COVID) ARPGX2
Influenza A by PCR: NEGATIVE
Influenza B by PCR: NEGATIVE
SARS Coronavirus 2 by RT PCR: NEGATIVE

## 2022-04-23 LAB — COMPREHENSIVE METABOLIC PANEL
ALT: 28 U/L (ref 0–44)
AST: 26 U/L (ref 15–41)
Albumin: 4.2 g/dL (ref 3.5–5.0)
Alkaline Phosphatase: 61 U/L (ref 38–126)
Anion gap: 8 (ref 5–15)
BUN: 29 mg/dL — ABNORMAL HIGH (ref 8–23)
CO2: 21 mmol/L — ABNORMAL LOW (ref 22–32)
Calcium: 9.2 mg/dL (ref 8.9–10.3)
Chloride: 108 mmol/L (ref 98–111)
Creatinine, Ser: 1.89 mg/dL — ABNORMAL HIGH (ref 0.61–1.24)
GFR, Estimated: 35 mL/min — ABNORMAL LOW (ref >60)
Glucose, Bld: 120 mg/dL — ABNORMAL HIGH (ref 70–99)
Potassium: 4.9 mmol/L (ref 3.5–5.1)
Sodium: 137 mmol/L (ref 135–145)
Total Bilirubin: 1.3 mg/dL — ABNORMAL HIGH (ref 0.3–1.2)
Total Protein: 8.6 g/dL — ABNORMAL HIGH (ref 6.5–8.1)

## 2022-04-23 LAB — BRAIN NATRIURETIC PEPTIDE: B Natriuretic Peptide: 111 pg/mL — ABNORMAL HIGH (ref 0.0–100.0)

## 2022-04-23 LAB — TROPONIN I (HIGH SENSITIVITY): Troponin I (High Sensitivity): 11 ng/L (ref <18)

## 2022-04-23 IMAGING — DX DG CHEST 1V PORT
1 series · 1 of 1 positions shown · non-contrast
Comparison: Chest radiograph [DATE]

CLINICAL DATA: Syncope, fall

EXAM:
PORTABLE CHEST 1 VIEW

[chest ap]
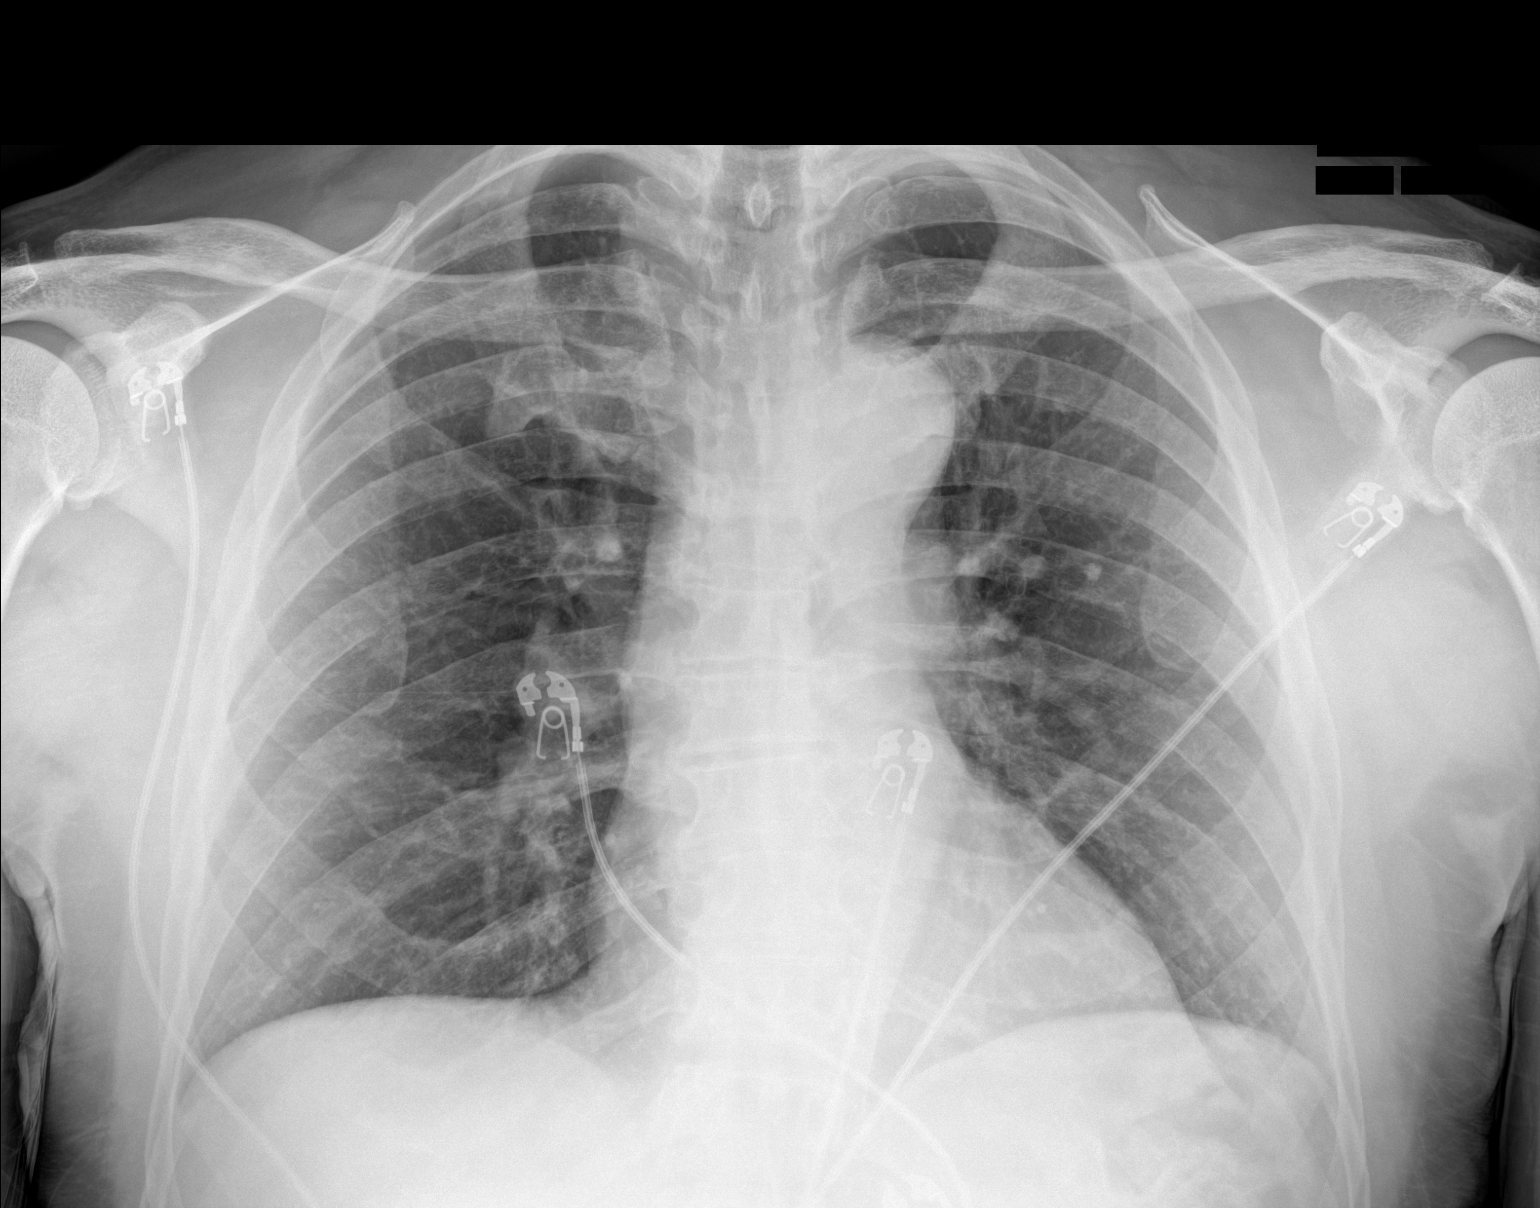

[1 of 1 positions shown; findings below may reference images not displayed]

FINDINGS: The cardiomediastinal silhouette is normal.

There is no focal consolidation or pulmonary edema. There is no
pleural effusion or pneumothorax.

There is no acute osseous abnormality.
IMPRESSION: No radiographic evidence of acute cardiopulmonary process.

## 2022-04-23 IMAGING — CT CT HEAD W/O CM
4 series · 15 of 47 positions shown, 17 images · non-contrast
Comparison: Brain MRI [DATE] CT head [DATE], CTA head and
neck [DATE]

CLINICAL DATA: Fall



[Series 2: head wo · axial · 0.41mm/px · z∈[-105,+15]mm · 7 of 32 slices shown, 9 images]
[im 4/32  brain]
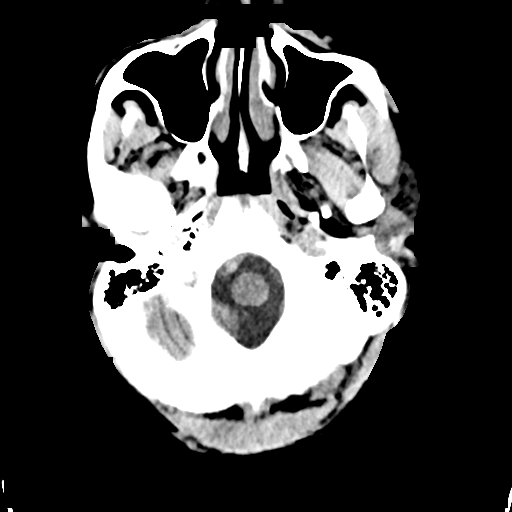
[im 4/32  bone]
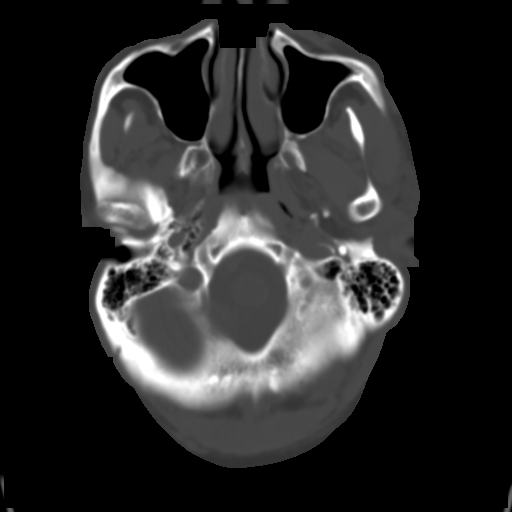
[im 8/32  brain]
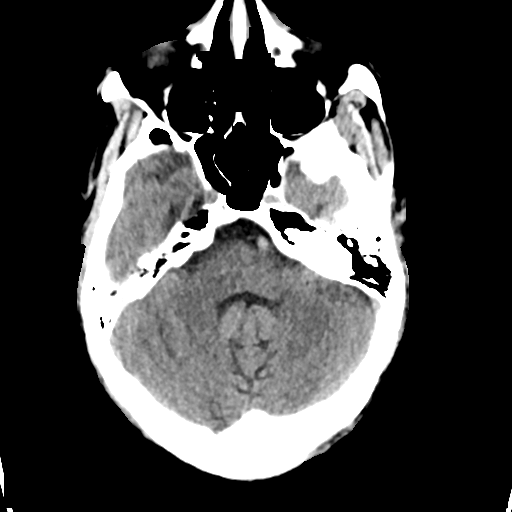
[im 12/32  brain]
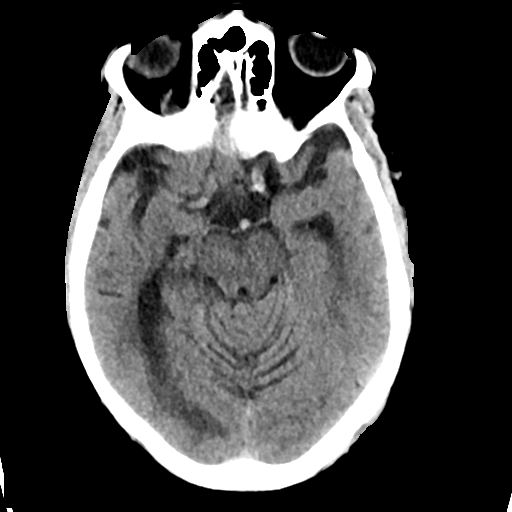
[im 16/32  brain]
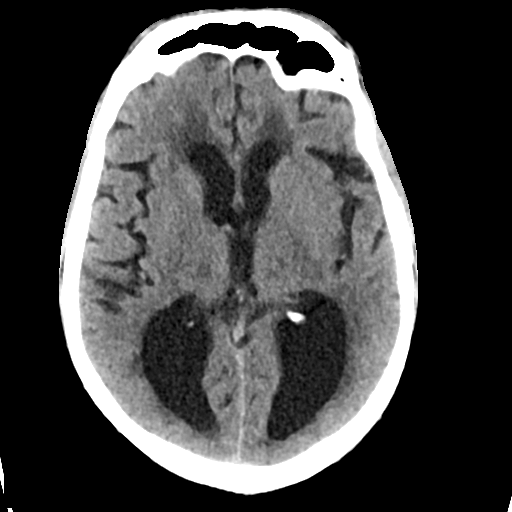
[im 20/32  brain]
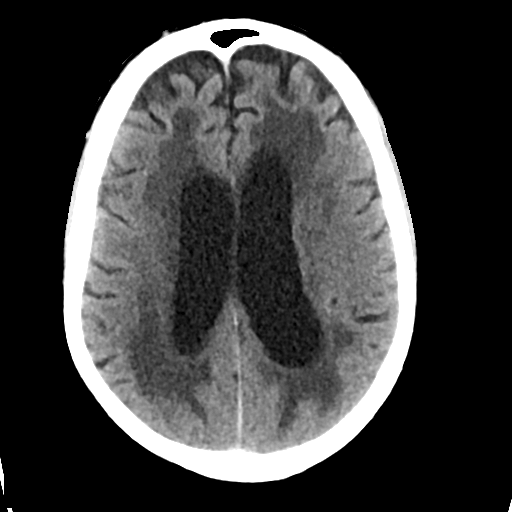
[im 20/32  bone]
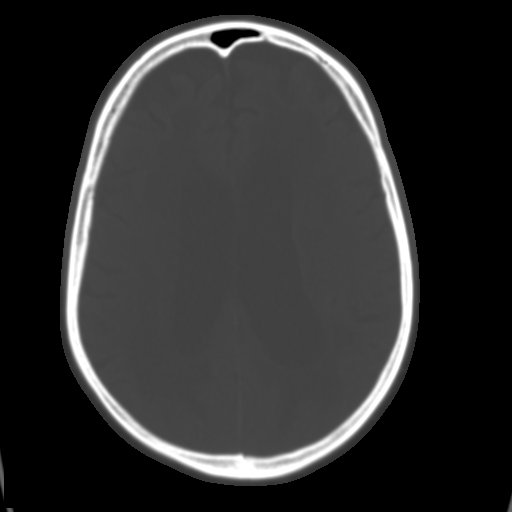
[im 24/32  brain]
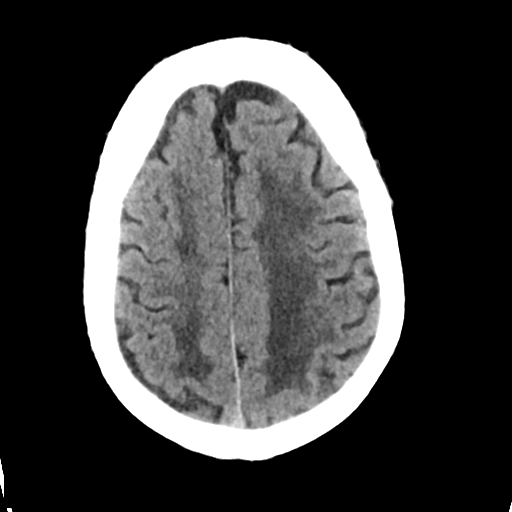
[im 28/32  brain]
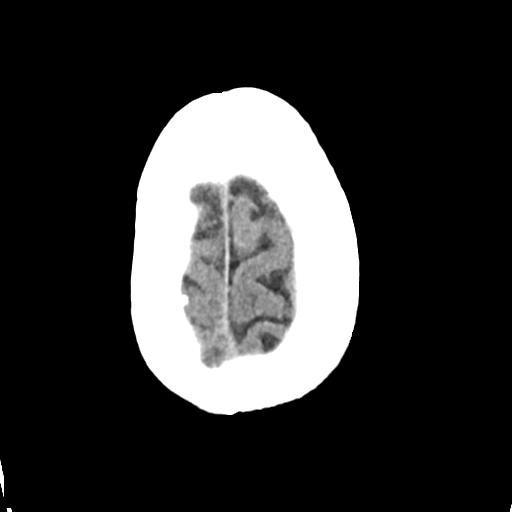

[Series 3: head bone · axial · 0.41mm/px · z∈[-106,-90]mm · 2 of 79 slices shown]
[im 8/79  bone]
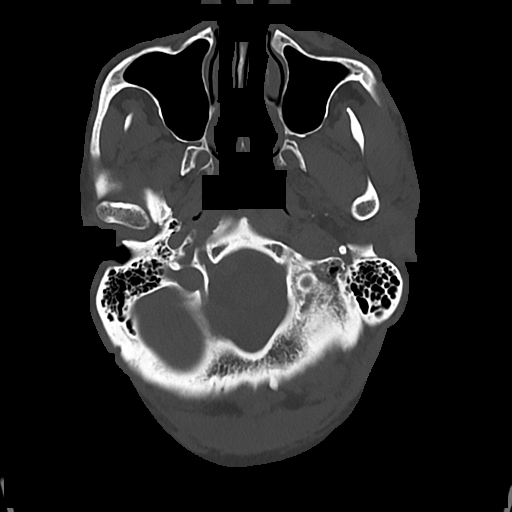
[im 16/79  bone]
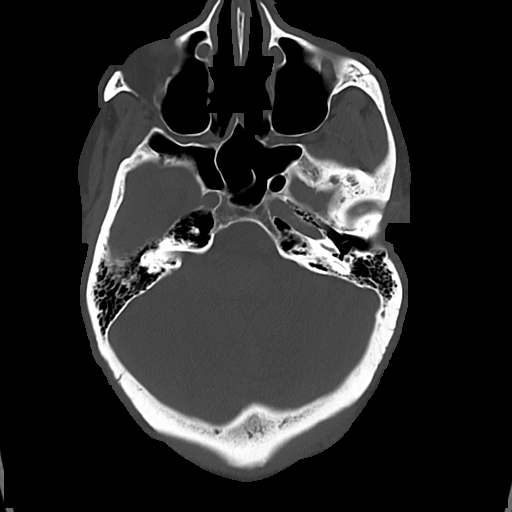

[Series 4: cor soft · coronal · 0.33mm/px · 3 of 75 slices shown]
[im 25/75  brain]
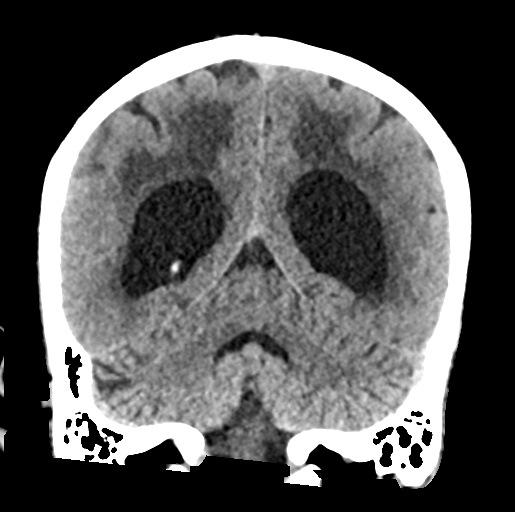
[im 33/75  brain]
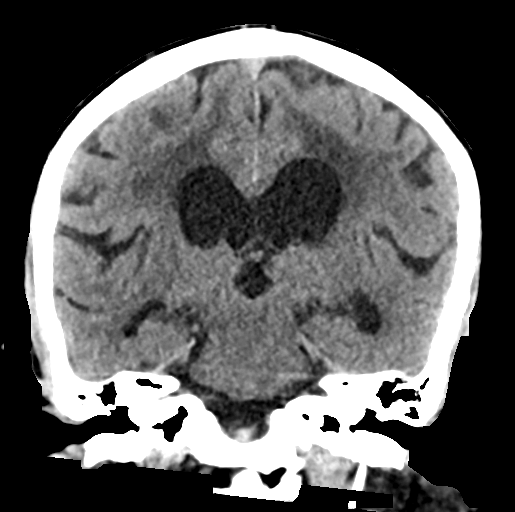
[im 42/75  brain]
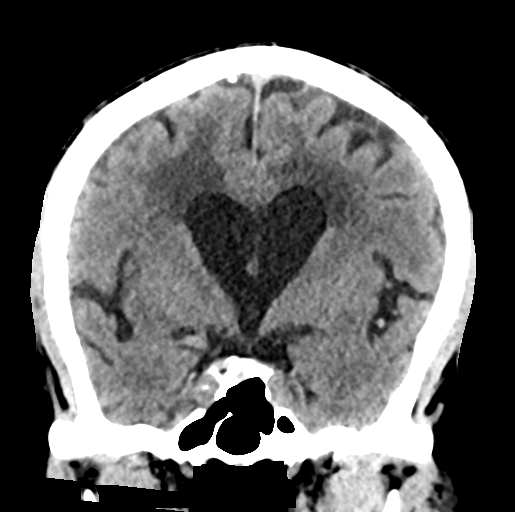

[Series 5: sag soft · sagittal · 0.33mm/px · 3 of 57 slices shown]
[im 19/57  brain]
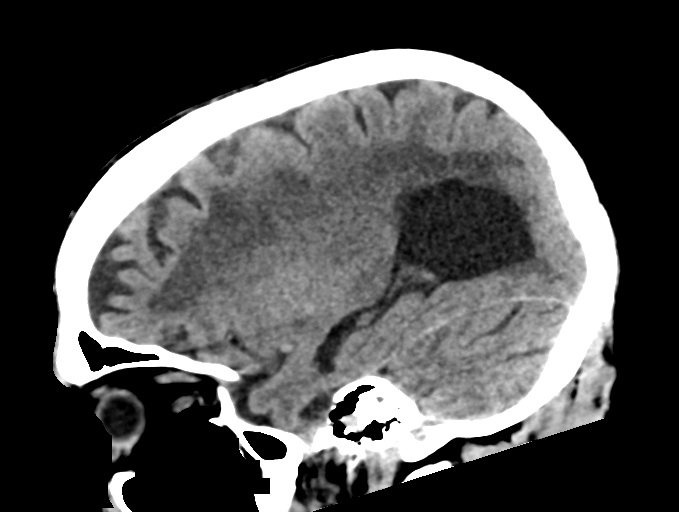
[im 29/57  brain]
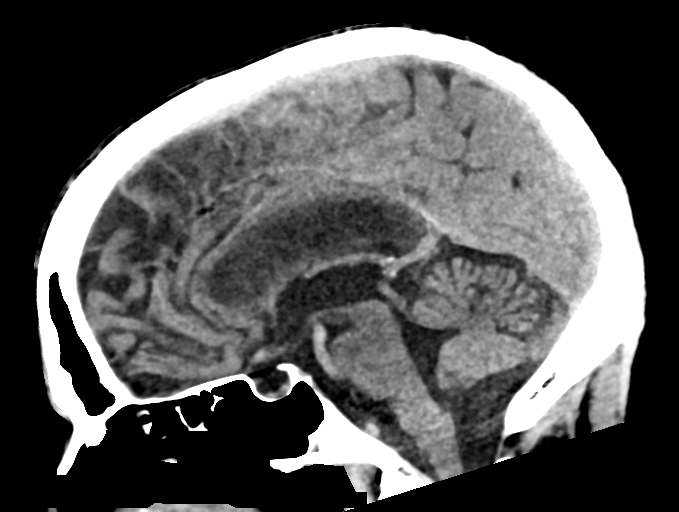
[im 38/57  brain]
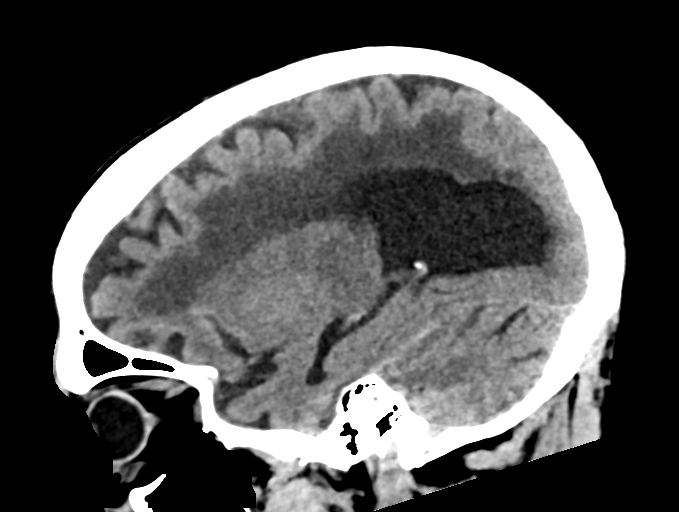

[15 of 47 positions shown; findings below may reference images not displayed]

FINDINGS: CT HEAD FINDINGS

Brain: There is no acute intracranial hemorrhage, extra-axial fluid
collection, or acute infarct.

There is moderate predominantly central parenchymal volume loss with
commensurate enlargement of the ventricular system, unchanged.
Extensive confluent hypodensity throughout the supratentorial white
matter consistent with advanced chronic white matter
microangiopathy, also unchanged. Gray-white differentiation is
preserved.

There is no mass lesion.  There is no mass effect or midline shift.

Vascular: No hyperdense vessel or unexpected calcification.

Skull: Normal. Negative for fracture or focal lesion.

Sinuses/Orbits: The paranasal sinuses are clear. A right lens
implant is noted. The globes and orbits are otherwise unremarkable.

Other: None.

CT CERVICAL SPINE FINDINGS

Alignment: Normal. There is no antero or retrolisthesis. There is no
jumped or perched facets or other evidence of traumatic
malalignment.

Skull base and vertebrae: Skull base alignment is maintained.
Vertebral body heights are preserved. There is no evidence of acute
fracture.

Soft tissues and spinal canal: No prevertebral fluid or swelling. No
visible canal hematoma.

Disc levels: There is multilevel disc space narrowing and associated
degenerative endplate change, most advanced at C4-C5 and C5-C6.
There is relatively mild multilevel facet arthropathy, most advanced
at C5-C6 with neural foraminal stenosis greatest on the left at
C4-C5 and C5-C6. There is no high-grade spinal canal stenosis.

Upper chest: The imaged lung apices are clear.

Other: None.
IMPRESSION: 1. Stable appearance of the brain with no acute intracranial
pathology.
2. No acute fracture or traumatic malalignment of the cervical
spine.

## 2022-04-23 NOTE — TOC Initial Note (Signed)
Transition of Care [facility]) - Initial/Assessment Note    Patient Details  Name: Robert Zamora MRN: [id number] Date of Birth: [birth year]  Transition of Care [facility]) CM/SW Contact:    Shelbie Hutching, RN Phone Number: 04/23/2022, 12:36 PM  Clinical Narrative:                 Patient brought into the emergency room for increased weakness and falls.  Patient does not need to be admitted to the hospital.  RNCM called and spoke with patient's wife and primary care giver.  She reports that the patient is steadily declining, he is weaker and falling more and she cannot pick him up.  He is mostly in a wheelchair.   We discussed rehab and long term nursing home care.  At this time they cannot afford long term nursing home care.  She would like to get patient a hospital bed for home and we will add a SW to the home health orders so they can follow patient and see if they qualify for Medicaid and or any other community services. Patient is open with [facility] for RN and PT.  ED MD will order [facility] PT, OT, aide and [place] with [facility] notified of changes and that patient will discharge.  Also asked if they can look into increasing frequency of PT visits. RNCM will make a referral to OP Palliative with [facility] for another layer of support as patient's dementia is progressive.   Hospital bed ordered from [place], wife needs to get home and make room for the bed so she requests that it be delivered tomorrow.  [facility] will reach out to wife and plan for delivery tomorrow.  Patient will need EMS transport home which ED Secretary will arrange.      Expected Discharge Plan: [place] Barriers to Discharge: Continued Medical Work up   Patient Goals and CMS Choice Patient states their goals for this hospitalization and ongoing recovery are:: Wife unsure of what to do as patient is declining in health CMS Medicare.gov Compare Post Acute Care list provided to:: Patient Represenative (must  comment) Choice offered to / list presented to : Spouse  Expected Discharge Plan and Services Expected Discharge Plan: [place]   Discharge Planning Services: CM Consult Post Acute Care Choice: Home Health, Resumption of Svcs/PTA Provider Living arrangements for the past 2 months: Single Family Home                 DME Arranged: Hospital bed DME Agency: [facility]       HH Arranged: PT, Nurse's Aide, Social Work, OT [employer] Agency: [facility] Date [place]: 04/23/22 Time [place]: 1227 Representative spoke with at [place]: Corene Cornea  Prior Living Arrangements/Services Living arrangements for the past 2 months: [place] Lives with:: Spouse Patient language and need for interpreter reviewed:: Yes Do you feel safe going back to the place where you live?: Yes      Need for Family Participation in Patient Care: Yes (Comment) Care giver support system in place?: Yes (comment) Current home services: DME, Home PT, Home RN (wheelchair) Criminal Activity/Legal Involvement Pertinent to Current Situation/Hospitalization: No - Comment as needed  Activities of Daily Living      Permission Sought/Granted Permission sought to share information with : Case Manager, Family Supports, Other (comment) Permission granted to share information with : Yes, Verbal Permission Granted  Share Information with NAME: Hussien Greenblatt  Permission granted to share info w AGENCY: [facility]  Permission granted to share info w Relationship: spouse     Emotional Assessment       Orientation: : Fluctuating Orientation (Suspected and/or reported Sundowners) Alcohol / Substance Use: Not Applicable Psych Involvement: No (comment)  Admission diagnosis:  AMS, EMS Patient Active Problem List   Diagnosis Date Noted   Right sided weakness 01/13/2022   Stage 3b chronic kidney disease (CKD) ([place]) 01/13/2022   TIA (transient ischemic attack)     Lymphedema 12/26/2021   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   Depression 12/24/2021   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/24/2021   Chronic venous insufficiency 12/24/2021   GERD (gastroesophageal reflux disease) 12/24/2021   Abnormal SPEP 08/19/2021   Benign prostatic hyperplasia with urinary frequency 04/14/2021   Bradykinesia 04/14/2021   HTN (hypertension), benign 04/14/2021   PAD (peripheral artery disease) ([place]) 04/14/2021   PCP:  Teodora Medici, DO Pharmacy:   [id number] [address], [place] AT [address] [place] [address] Phone: [phone] Fax: [phone]  [facility] [place], [place] [address] Phone: [phone] Fax: [phone]     Social Determinants of Health (SDOH) Interventions    Readmission Risk Interventions     No data to display

## 2022-04-23 NOTE — ED Provider Notes (Signed)
[facility] Provider Note   Event Date/Time   First MD Initiated Contact with Patient 04/23/22 [phone]     (approximate) History  Fall  HPI Robert Zamora is a 81 y.o. male with a history of dementia who presents after a mechanical fall from standing and being found down by caregiver today.  Patient's wife and CNA at bedside the provides care for him.  Both are concerned as patient has had increase in falls over the last month and a half.  Patient was seen by physical therapy within the last week and was told that he would likely need a higher level of care given his increase in falls and inability to use his lower extremities efficiently.  Patient has not been complaining of any pain after this fall and EMS noted no external signs of trauma.  Family requesting evaluation of possible placement options versus increasing home care for patient. ROS: Unable to assess secondary to mental status   Physical Exam  Triage Vital Signs: ED Triage Vitals  Enc Vitals Group     BP 04/23/22 0916 (!) 159/93     Pulse Rate 04/23/22 0916 85     Resp 04/23/22 0916 (!) 25     Temp 04/23/22 0916 100 F (37.8 C)     Temp Source 04/23/22 0916 Oral     SpO2 04/23/22 0916 95 %     Weight 04/23/22 0917 229 lb 4.5 oz (104 kg)     Height 04/23/22 0917 '6\' 3"'$  (1.905 m)     Head Circumference --      Peak Flow --      Pain Score 04/23/22 0917 0     Pain Loc --      Pain Edu? --      Excl. in [place]? --    Most recent vital signs: Vitals:   04/23/22 0930 04/23/22 1100  BP: (!) 151/91 (!) 156/89  Pulse: 79 83  Resp: (!) 24 (!) 25  Temp:    SpO2: 98% 97%   General: Awake, cooperative CV:  Good peripheral perfusion.  Resp:  Normal effort.  Abd:  No distention.  Other:  Elderly African-American male laying in bed in no acute distress ED Results / Procedures / Treatments  Labs (all labs ordered are listed, but only abnormal results are displayed) Labs Reviewed  COMPREHENSIVE METABOLIC  PANEL - Abnormal; Notable for the following components:      Result Value   CO2 21 (*)    Glucose, Bld 120 (*)    BUN 29 (*)    Creatinine, Ser 1.89 (*)    Total Protein 8.6 (*)    Total Bilirubin 1.3 (*)    GFR, Estimated 35 (*)    All other components within normal limits  CBC WITH DIFFERENTIAL/PLATELET - Abnormal; Notable for the following components:   WBC 10.7 (*)    Neutro Abs 8.9 (*)    All other components within normal limits  BRAIN NATRIURETIC PEPTIDE - Abnormal; Notable for the following components:   B Natriuretic Peptide 111.0 (*)    All other components within normal limits  URINALYSIS, ROUTINE W REFLEX MICROSCOPIC - Abnormal; Notable for the following components:   Color, Urine YELLOW (*)    APPearance CLOUDY (*)    Hgb urine dipstick SMALL (*)    Protein, ur 100 (*)    Nitrite POSITIVE (*)    Leukocytes,Ua LARGE (*)    WBC, UA >50 (*)    Bacteria, UA  FEW (*)    All other components within normal limits  RESP PANEL BY RT-PCR (FLU A&B, COVID) [id number]  TROPONIN I (HIGH SENSITIVITY)  TROPONIN I (HIGH SENSITIVITY)   EKG ED ECG REPORT I, Naaman Plummer, the attending physician, personally viewed and interpreted this ECG. 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  No evidence of acute ischemia RADIOLOGY ED MD interpretation: CT of the head without contrast interpreted by me shows no evidence of acute abnormalities including no intracerebral hemorrhage, obvious masses, or significant edema  CT of the cervical spine interpreted by me does not show any evidence of acute abnormalities including no acute fracture, malalignment, height loss, or dislocation  One-view portable chest x-ray interpreted by me shows no evidence of acute abnormalities including no  pneumonia, pneumothorax, or widened mediastinum -Agree with radiology assessment Official radiology report(s): CT Head Wo Contrast  Result Date: 04/23/2022 CLINICAL DATA:  Fall EXAM: CT HEAD WITHOUT CONTRAST CT CERVICAL SPINE WITHOUT CONTRAST TECHNIQUE: Multidetector CT imaging of the head and cervical spine was performed following the standard protocol without intravenous contrast. Multiplanar CT image reconstructions of the cervical spine were also generated. RADIATION DOSE REDUCTION: This exam was performed according to the departmental dose-optimization program which includes automated exposure control, adjustment of the mA and/or kV according to patient size and/or use of iterative reconstruction technique. COMPARISON:  Brain MRI 01/13/2022 CT head 03/25/2022, CTA head and neck 01/13/2022 FINDINGS: CT HEAD FINDINGS Brain: There is no acute intracranial hemorrhage, extra-axial fluid collection, or acute infarct. There is moderate predominantly central parenchymal volume loss with commensurate enlargement of the ventricular system, unchanged. Extensive confluent hypodensity throughout the supratentorial white matter consistent with advanced chronic white matter microangiopathy, also unchanged. Gray-white differentiation is preserved. There is no mass lesion.  There is no mass effect or midline shift. Vascular: No hyperdense vessel or unexpected calcification. Skull: Normal. Negative for fracture or focal lesion. Sinuses/Orbits: The paranasal sinuses are clear. A right lens implant is noted. The globes and orbits are otherwise unremarkable. Other: None. CT CERVICAL SPINE FINDINGS Alignment: Normal. There is no antero or retrolisthesis. There is no jumped or perched facets or other evidence of traumatic malalignment. Skull base and vertebrae: Skull base alignment is maintained. Vertebral body heights are preserved. There is no evidence of acute fracture. Soft tissues and spinal canal: No prevertebral fluid or  swelling. No visible canal hematoma. Disc levels: There is multilevel disc space narrowing and associated degenerative endplate change, most advanced at C4-C5 and C5-C6. There is relatively mild multilevel facet arthropathy, most advanced at C5-C6 with neural foraminal stenosis greatest on the left at C4-C5 and C5-C6. There is no high-grade spinal canal stenosis. Upper chest: The imaged lung apices are clear. Other: None. IMPRESSION: 1. Stable appearance of the brain with no acute intracranial pathology. 2. No acute fracture or traumatic malalignment of the cervical spine. Electronically Signed   By: Valetta Mole M.D.   On: 04/23/2022 10:32   CT Cervical Spine Wo Contrast  Result Date: 04/23/2022 CLINICAL DATA:  Fall EXAM: CT HEAD WITHOUT CONTRAST CT CERVICAL SPINE WITHOUT CONTRAST TECHNIQUE: Multidetector CT imaging of the head and cervical spine was performed following the standard protocol without intravenous contrast. Multiplanar CT image reconstructions of the cervical spine were also generated. RADIATION DOSE REDUCTION: This exam was performed according to the departmental dose-optimization  program which includes automated exposure control, adjustment of the mA and/or kV according to patient size and/or use of iterative reconstruction technique. COMPARISON:  Brain MRI 01/13/2022 CT head 03/25/2022, CTA head and neck 01/13/2022 FINDINGS: CT HEAD FINDINGS Brain: There is no acute intracranial hemorrhage, extra-axial fluid collection, or acute infarct. There is moderate predominantly central parenchymal volume loss with commensurate enlargement of the ventricular system, unchanged. Extensive confluent hypodensity throughout the supratentorial white matter consistent with advanced chronic white matter microangiopathy, also unchanged. Gray-white differentiation is preserved. There is no mass lesion.  There is no mass effect or midline shift. Vascular: No hyperdense vessel or unexpected calcification. Skull:  Normal. Negative for fracture or focal lesion. Sinuses/Orbits: The paranasal sinuses are clear. A right lens implant is noted. The globes and orbits are otherwise unremarkable. Other: None. CT CERVICAL SPINE FINDINGS Alignment: Normal. There is no antero or retrolisthesis. There is no jumped or perched facets or other evidence of traumatic malalignment. Skull base and vertebrae: Skull base alignment is maintained. Vertebral body heights are preserved. There is no evidence of acute fracture. Soft tissues and spinal canal: No prevertebral fluid or swelling. No visible canal hematoma. Disc levels: There is multilevel disc space narrowing and associated degenerative endplate change, most advanced at C4-C5 and C5-C6. There is relatively mild multilevel facet arthropathy, most advanced at C5-C6 with neural foraminal stenosis greatest on the left at C4-C5 and C5-C6. There is no high-grade spinal canal stenosis. Upper chest: The imaged lung apices are clear. Other: None. IMPRESSION: 1. Stable appearance of the brain with no acute intracranial pathology. 2. No acute fracture or traumatic malalignment of the cervical spine. Electronically Signed   By: Valetta Mole M.D.   On: 04/23/2022 10:32   DG Chest Port 1 View  Result Date: 04/23/2022 CLINICAL DATA:  Syncope, fall EXAM: PORTABLE CHEST 1 VIEW COMPARISON:  Chest radiograph 08/10/2021 FINDINGS: The cardiomediastinal silhouette is normal. There is no focal consolidation or pulmonary edema. There is no pleural effusion or pneumothorax. There is no acute osseous abnormality. IMPRESSION: No radiographic evidence of acute cardiopulmonary process. Electronically Signed   By: Valetta Mole M.D.   On: 04/23/2022 09:54   PROCEDURES: Critical Care performed: No Procedures MEDICATIONS ORDERED IN ED: Medications - No data to display IMPRESSION / MDM / [place] / ED COURSE  I reviewed the triage vital signs and the nursing notes.                             The  patient is on the cardiac monitor to evaluate for evidence of arrhythmia and/or significant heart rate changes. Patient's presentation is most consistent with acute presentation with potential threat to life or bodily function. Presenting after a fall that occurred just prior to arrival. The mechanism of injury was a mechanical ground level fall with unknown syncope or near-syncope. The current level of pain is unknown due to mental status however patient shows no external signs of wincing, moaning, or abnormal vital signs concerning for significant level of pain There was no loss of consciousness, confusion, seizure, or memory impairment. There is not a laceration associated with the injury. Denies neck pain. The patient does not take blood thinner medications. Denies vomiting, numbness/weakness, fever  As patient has had multiple falls within the past month and a half and he does not have adequate care at home, patient will require evaluation by our transition of care team  FINAL CLINICAL IMPRESSION(S) / ED DIAGNOSES   Final diagnoses:  Fall, initial encounter   Rx / DC Orders   ED Discharge Orders     None      Note:  This document was prepared using Dragon voice recognition software and may include unintentional dictation errors.   Naaman Plummer, MD 04/23/22 [phone]

## 2022-04-23 NOTE — Progress Notes (Signed)
Patient has mixed alzheimer's and vascular dementia which requires head and body to be positioned in ways not feasible with a normal bed. Head must be elevated at least 30 degrees or patient has difficulty swallowing and managing secretions.  With mixed dementia the patient requires frequent changes in body position which cannot be achieved with a normal bed.

## 2022-04-23 NOTE — Progress Notes (Signed)
[facility]) Hospital Liaison Note  Notified by Brooks Sailors patient/family request for [facility] Palliative services at home after discharge.   [facility] liaison will follow patient for discharge disposition.   Please call with any Hospice/Palliative related questions or concerns.   Thank you for the opportunity to participate in this patient's care  Jhonnie Garner RN, BSN, [facility] [phone]

## 2022-04-23 NOTE — ED Notes (Signed)
Called [facility] for transport back to residence

## 2022-04-23 NOTE — ED Triage Notes (Signed)
Pt to ED via [facility] from home. Pt sustained unwitnessed fall at home. Family reports pt has been falling more frequently. Pt with hx dementia. Pt states he doesn't remember the fall or if he hit his head. Pt family wanting pt evaluated. Pt denies any pain.   EMS VS:  Temp 100.0 BP 149/88 CBG 126 99% RA RR 18 CO 30

## 2022-04-25 LAB — CULTURE, URINE COMPREHENSIVE

## 2022-04-26 ENCOUNTER — Telehealth: Payer: Self-pay

## 2022-04-26 ENCOUNTER — Telehealth: Payer: Self-pay | Admitting: Internal Medicine

## 2022-04-26 DIAGNOSIS — F0150 Vascular dementia without behavioral disturbance: Secondary | ICD-10-CM

## 2022-04-26 NOTE — Telephone Encounter (Signed)
I spoke with Santiago Glad from [employer] and clarified the patient's telephone number.

## 2022-04-26 NOTE — Telephone Encounter (Unsigned)
Copied from [place] [phone]. Topic: General - Other >> Apr 26, 2022  3:30 PM Rudene Anda wrote: Reason for CRM: [facility] hospice called in to see if Dr Rosana Berger would approve hospice orders, please advise. [phone] option 2.

## 2022-04-26 NOTE — Telephone Encounter (Signed)
Robert Zamora from [place], [place] on triage line requesting that Robert Zamora, [place], give her a call back in regards to this mutual patient, please call [phone].

## 2022-04-27 ENCOUNTER — Telehealth (INDEPENDENT_AMBULATORY_CARE_PROVIDER_SITE_OTHER): Payer: Medicare Other | Admitting: Family Medicine

## 2022-04-27 ENCOUNTER — Encounter (INDEPENDENT_AMBULATORY_CARE_PROVIDER_SITE_OTHER): Payer: Self-pay

## 2022-04-27 DIAGNOSIS — N390 Urinary tract infection, site not specified: Secondary | ICD-10-CM

## 2022-04-27 DIAGNOSIS — G309 Alzheimer's disease, unspecified: Secondary | ICD-10-CM

## 2022-04-27 DIAGNOSIS — R296 Repeated falls: Secondary | ICD-10-CM | POA: Diagnosis not present

## 2022-04-27 DIAGNOSIS — F0280 Dementia in other diseases classified elsewhere without behavioral disturbance: Secondary | ICD-10-CM

## 2022-04-27 DIAGNOSIS — N3001 Acute cystitis with hematuria: Secondary | ICD-10-CM | POA: Diagnosis not present

## 2022-04-27 DIAGNOSIS — F0150 Vascular dementia without behavioral disturbance: Secondary | ICD-10-CM

## 2022-04-27 NOTE — Telephone Encounter (Signed)
Langley Gauss called again and Needs orders for palliative care services / please advise asap

## 2022-04-27 NOTE — Telephone Encounter (Signed)
Denise from [facility] is calling again to see if someone can sign order for palliative care services. Pt was seen by Dr Ky Barban this afternoon via [place].

## 2022-04-27 NOTE — Progress Notes (Signed)
Virtual Visit via Video Note  I connected with Robert Zamora on 04/27/22 at  3:20 PM EDT by a video enabled telemedicine application and verified that I am speaking with the correct person using two identifiers.  Location: Patient: home Provider: [facility] Wife also on call who provides history.   I discussed the limitations of evaluation and management by telemedicine and the availability of in person appointments. The patient expressed understanding and agreed to proceed.  History of Present Illness:  UTI Frequent falls Has been having more frequent falls with unsteady gait since March, really worsened over the past week with onset of malodorous urine and increased confusion. Saw urology 6/15 with I&O sterile cath with >100k colonies of E coli, pansensitive. Started on Bactrim on 6/17 evening, tolerating well. Wife hasn't seen much improvement in confusion and weakness and is now requiring assistance with transfers. Has a hoyer lift at home with [facility] coming out 5 days per week for 4 hours per day. In hospital bed at home. Wondering if increased confusion and weakness result of UTI or progression of already advanced disease. Also wanting to know status of palliative care referral placed by the hospital. Follows with Neurology for Mixed Alzheimer's and Vascular Dementia, on aricept and namenda, seeing [facility] in a few weeks. Continues to follow with Vascular Surgery and Urology.   Observations/Objective:  Well appearing, in NAD. In hospital bed at home. Speaks few words.   Assessment and Plan:  UTI Reviewed recent culture, sensitive to current treatment, tolerating well, continue. Confusion likely multifactorial with current infection in patient with decreased cognitive reserve in advanced dementia. Advised to continue treatment, emergency precautions discussed.  Mixed Alzheimer's and vascular dementia Contributing to difficult gait/weakness, made worse by current infection. Reviewed  orders/resources with wife, getting set up with palliative care, gave number to call for appointment. Has HH PT/OT/aide, awaiting HH SLP, referral already placed. Await evaluation with [facility].    I discussed the assessment and treatment plan with the patient. The patient was provided an opportunity to ask questions and all were answered. The patient agreed with the plan and demonstrated an understanding of the instructions.   The patient was advised to call back or seek an in-person evaluation if the symptoms worsen or if the condition fails to improve as anticipated.  I provided 31 minutes of non-face-to-face time during this encounter.   Myles Gip, DO

## 2022-04-28 ENCOUNTER — Telehealth: Payer: Self-pay | Admitting: Student

## 2022-04-28 NOTE — Telephone Encounter (Signed)
Attempted to contact patient's wife Olin Hauser, to offer to schedule a Palliative Consult, no answer - left message with reason for call along with my name and call back number.

## 2022-04-28 NOTE — Telephone Encounter (Signed)
Ret'd call to patient's wife Olin Hauser, and discussed the Palliative referral/services with her and all questions were answered and she was in agreement with beginning services with [place].  I have scheduled an In-home Consult for 04/29/22 @ 3 PM

## 2022-04-29 ENCOUNTER — Encounter: Payer: Self-pay | Admitting: Family Medicine

## 2022-04-29 ENCOUNTER — Other Ambulatory Visit: Payer: Medicare Other | Admitting: Student

## 2022-04-29 DIAGNOSIS — F0150 Vascular dementia without behavioral disturbance: Secondary | ICD-10-CM

## 2022-04-29 DIAGNOSIS — N390 Urinary tract infection, site not specified: Secondary | ICD-10-CM

## 2022-04-29 DIAGNOSIS — K5900 Constipation, unspecified: Secondary | ICD-10-CM

## 2022-04-29 DIAGNOSIS — Z515 Encounter for palliative care: Secondary | ICD-10-CM

## 2022-04-29 NOTE — Progress Notes (Unsigned)
[facility] Palliative Care Consult Note Telephone: [phone]  Fax: [phone]   Date of encounter: 04/29/22 2:59 PM PATIENT NAME: Robert Zamora [address] [place] [address]   [phone] (home)  DOB: 06-09-1941 MRN: [id number] PRIMARY CARE PROVIDER:    Wellington Zamora  [address] [place] [address] [phone]  REFERRING PROVIDER:   Teodora Zamora, [place],  [place] [id number] [phone]  RESPONSIBLE PARTY:    Contact Information     Name Relation Home Work Robert Zamora   [phone]        I met face to face with patient and family in the home. Palliative Care was asked to follow this patient by consultation request of  Robert Medici, DO to address advance care planning and complex medical decision making. This is the initial visit.                                     ASSESSMENT AND PLAN / RECOMMENDATIONS:   Advance Care Planning/Goals of Care: Goals include to maximize quality of life and symptom management. Patient/health care surrogate gave his/her permission to discuss.Our advance care planning conversation included a discussion about:    The value and importance of advance care planning  Experiences with loved ones who have been seriously ill or have died  Exploration of personal, cultural or spiritual beliefs that might influence medical decisions  Exploration of goals of care in the event of a sudden injury or illness  Identification  of a healthcare agent  CODE STATUS: DNR  Symptom Management/Plan:  Dementia- SW for list of day programs. Recommendations of dementia support.   Constipation-  Appetite-  UTI  Follow up Palliative Care Visit: Palliative care will continue to follow for complex medical decision making, advance care planning, and clarification of goals. Return *** weeks or prn.  I spent *** minutes  providing this consultation. More than 50% of the time in this consultation was spent in counseling and care coordination.  This visit was coded based on medical decision making (MDM).***  PPS: ***0%  HOSPICE ELIGIBILITY/DIAGNOSIS: TBD  Chief Complaint: Palliative Medicine follow up visit.   HISTORY OF PRESENT ILLNESS:  Robert Zamora is a 81 y.o. year old male  with dementia, right sided weakness, mixed alzheimer's and vascular dementia, CKD 3b.   Dx in 2018. Decline since 2019, increasingly unsteady in past 2 years. decline in the past year. Cane up until last year; upright walker in the past year. Gait and balance worse since March/April. 2 lumbar drains last year; symptoms actually worsened. ED 04/23/22 due to fall. Currently receiving PT/OT with [facility]. 3 falls in the past week. Has fallen trying to get out of bed. Leans to left side. Increased confusion. No difficulty swallowing foods, but having more difficulty wallowing medications. Endorses good appetite; some weight loss. Size 42, 40 now. Has applied for MOW. Afex male urinal system. Sleeping good at night; napping more during the day. Has a caregiver coming in 4 hours a day. Less engaged.     History obtained from review of EMR, discussion with primary team, and interview with family, facility staff/caregiver and/or Robert Zamora.  I reviewed available labs, medications, imaging, studies and related documents from the EMR.  Records reviewed and summarized above.   ROS  *** General: NAD EYES: denies vision changes ENMT:  denies dysphagia Cardiovascular: denies chest pain, denies DOE Pulmonary: denies cough, denies increased SOB Abdomen: endorses good appetite, denies constipation, endorses continence of bowel GU: denies dysuria, endorses continence of urine MSK:  denies increased weakness,  no falls reported Skin: denies rashes or wounds Neurological: denies pain, denies insomnia Psych: Endorses positive  mood Heme/lymph/immuno: denies bruises, abnormal bleeding  Physical Exam: Current and past weights: Constitutional: NAD General: frail appearing, thin/WNWD/obese  EYES: anicteric sclera, lids intact, no discharge  ENMT: intact hearing, oral mucous membranes moist, dentition intact CV: S1S2, RRR, no LE edema Pulmonary: LCTA, no increased work of breathing, no cough, room air Abdomen: intake 100%, normo-active BS + 4 quadrants, soft and non tender, no ascites GU: deferred MSK: no sarcopenia, moves all extremities, ambulatory Skin: warm and dry, no rashes or wounds on visible skin Neuro:  no generalized weakness,  no cognitive impairment Psych: non-anxious affect, A and O x 3 Hem/lymph/immuno: no widespread bruising CURRENT PROBLEM LIST:  Patient Active Problem List   Diagnosis Date Noted   Recurrent UTI 04/27/2022   Right sided weakness 01/13/2022   Stage 3b chronic kidney disease (CKD) ([place]) 01/13/2022   TIA (transient ischemic attack)    Lymphedema 12/26/2021   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   Depression 12/24/2021   Mixed Alzheimer's and vascular dementia ([place]) 12/24/2021   Chronic venous insufficiency 12/24/2021   GERD (gastroesophageal reflux disease) 12/24/2021   Abnormal SPEP 08/19/2021   Benign prostatic hyperplasia with urinary frequency 04/14/2021   Bradykinesia 04/14/2021   HTN (hypertension), benign 04/14/2021   PAD (peripheral artery disease) ([place]) 04/14/2021   PAST MEDICAL HISTORY:  Active Ambulatory Problems    Diagnosis Date Noted   Abnormal SPEP 08/19/2021   Benign prostatic hyperplasia with urinary frequency 04/14/2021   Bradykinesia 04/14/2021   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   Depression 12/24/2021   HTN (hypertension), benign 04/14/2021   Mixed Alzheimer's and vascular dementia ([place]) 12/24/2021   PAD (peripheral artery disease) ([facility]) 04/14/2021   Chronic venous insufficiency 12/24/2021   GERD (gastroesophageal reflux disease) 12/24/2021    Lymphedema 12/26/2021   Right sided weakness 01/13/2022   Stage 3b chronic kidney disease (CKD) ([place]) 01/13/2022   TIA (transient ischemic attack)    Recurrent UTI 04/27/2022   Resolved Ambulatory Problems    Diagnosis Date Noted   No Resolved Ambulatory Problems   Past Medical History:  Diagnosis Date   Alzheimer disease ([facility])    Cataract _0    Hypertension    Leg weakness    Urinary incontinence    SOCIAL HX:  Social History   Tobacco Use   Smoking status: Former    Passive exposure: Past   Smokeless tobacco: Never   Tobacco comments:    Quit 1980's   Substance Use Topics   Alcohol use: Never   FAMILY HX:  Family History  Problem Relation Age of Onset   Stroke Mother    Stroke Father    Cancer Neg Hx       ALLERGIES: No Known Allergies   PERTINENT MEDICATIONS:  Outpatient Encounter Medications as of 04/29/2022  Medication Sig   aspirin 81 MG EC tablet Take 81 mg by mouth daily.   buPROPion (WELLBUTRIN XL) 150 MG 24 hr tablet Take 1 tablet (150 mg total) by mouth daily.   buPROPion (WELLBUTRIN XL) 300 MG 24 hr tablet    Cholecalciferol 25 MCG (1000 UT) capsule Take 1,000 Units by mouth daily.   Cranberry 425 MG CAPS Take 1 capsule by mouth daily.  donepezil (ARICEPT) 10 MG tablet Take 10 mg by mouth at bedtime.   memantine (NAMENDA) 10 MG tablet Take 10 mg by mouth 2 (two) times daily.   olmesartan (BENICAR) 40 MG tablet Take 40 mg by mouth daily.   rosuvastatin (CRESTOR) 20 MG tablet Take 1 tablet (20 mg total) by mouth daily.   sulfamethoxazole-trimethoprim (BACTRIM DS) 800-160 MG tablet Take 1 tablet by mouth 2 (two) times daily for 7 days.   Turmeric (QC TUMERIC COMPLEX PO) Take by mouth.   No facility-administered encounter medications on file as of 04/29/2022.   Thank you for the opportunity to participate in the care of Robert Zamora.  The palliative care team will continue to follow. Please call our office at [phone] if we can be of additional  assistance.   Ezekiel Slocumb, NP ,   COVID-19 PATIENT SCREENING TOOL Asked and negative response unless otherwise noted:  Have you had symptoms of covid, tested positive or been in contact with someone with symptoms/positive test in the past 5-10 days?

## 2022-04-30 ENCOUNTER — Telehealth: Payer: Self-pay

## 2022-04-30 ENCOUNTER — Ambulatory Visit: Payer: Medicare Other

## 2022-04-30 DIAGNOSIS — Z789 Other specified health status: Secondary | ICD-10-CM

## 2022-05-03 ENCOUNTER — Telehealth: Payer: Self-pay

## 2022-05-03 NOTE — Telephone Encounter (Signed)
1030 am.  Follow up call made to [facility] per request of Gwinda Maine, NP to check on status of ST order.  Per Judeth Cornfield with [facility], they do not currently have a ST in-house and will not be able to provide services.

## 2022-05-03 NOTE — Telephone Encounter (Signed)
1037 am.  Phone call made to wife to update on her on therapy services.  No answer.  Message left on VM with call back information.

## 2022-05-05 ENCOUNTER — Ambulatory Visit: Payer: Medicare Other

## 2022-05-05 ENCOUNTER — Encounter: Payer: Self-pay | Admitting: Internal Medicine

## 2022-05-05 DIAGNOSIS — F0150 Vascular dementia without behavioral disturbance: Secondary | ICD-10-CM

## 2022-05-05 DIAGNOSIS — Z789 Other specified health status: Secondary | ICD-10-CM

## 2022-05-05 MED ORDER — DONEPEZIL HCL 10 MG PO TABS: 10.0000 mg | ORAL_TABLET | Freq: Every day | ORAL | 1 refills | Status: DC

## 2022-05-05 NOTE — Progress Notes (Signed)
Palliative Care Social Worker communicated with patient spouse via e-mail.   Spouse shared the following:  "Today Robert Zamora has stated, and I agree,  that he needs 24 x7 support - neither of [place] want him to need it, but we both know that he does. I'm thinking because he has dementia that assisted living would not be sufficient, and that he actually would require a nursing home - trying to figure out what we need to do in terms of protection of our tiny assets - so that we are not wiped out waiting for Medicaid to kick in.   He is refusing to eat , much less verbal, continues to sleep a lot and now is essentially bedbound his CNA can get him up and into the shower (where he sits down). Because of my injuries (hip and shoulders) I am unable to safely (for me and him) assist with getting up and down, in and out of bathroom, on/off commode etc. etc. He needs assistance with all of his ADLs."  Spouse is in need of guidance and assistance on next steps for patient,  SW responded with the following: "it sounds as though Robert Zamora is declining more functionally. If placement is what you are considering sooner than later then I would encourage you to consult with an elder law attorney about your finances and assets. They can provide you with a free consultation.   [employer]   [email or web address]   [phone]    Lastly, if you are open to it, Palliative care Vance Gather, RN and myself) can come do an in home visit prior to Latoya's visit to simply assess where Robert Zamora is functionally as well, and provide further feedback."  Spouse shared that she will outreach [employer] and is interested in scheduling a home visit with [facility] RN/SW. SW offered for [facility] to visit on 7/12 '@1130'$ , awaiting spouse to confirm.

## 2022-05-06 ENCOUNTER — Telehealth: Payer: Self-pay | Admitting: Internal Medicine

## 2022-05-06 DIAGNOSIS — F0150 Vascular dementia without behavioral disturbance: Secondary | ICD-10-CM

## 2022-05-06 DIAGNOSIS — R296 Repeated falls: Secondary | ICD-10-CM

## 2022-05-06 NOTE — Telephone Encounter (Signed)
Home OT orders placed.

## 2022-05-08 ENCOUNTER — Encounter: Payer: Self-pay | Admitting: Internal Medicine

## 2022-05-14 ENCOUNTER — Telehealth: Payer: Self-pay | Admitting: Internal Medicine

## 2022-05-14 NOTE — Telephone Encounter (Unsigned)
Copied from [place] [id number]. Topic: Quick Communication - Home Health Verbal Orders >> May 14, 2022  3:40 PM Ja-Kwan M wrote: Caller/Agency: Tiffany with [facility] Number:  [phone] Option# 2 Requesting OT/PT/Skilled Nursing/Social Work/Speech Therapy: PT Frequency: 2 week 3 and 1 week 6

## 2022-05-14 NOTE — Telephone Encounter (Signed)
Copied from [place] [id number]. Topic: Quick Communication - Home Health Verbal Orders >> May 14, 2022  3:36 PM Cyndi Bender wrote: Caller/Agency: Tiffany with [facility] Number: [phone] Option# 2 Requesting OT/PT/Skilled Nursing/Social Work/Speech Therapy: skilled nursing  Frequency: 1 week 3

## 2022-05-17 ENCOUNTER — Encounter: Payer: Self-pay | Admitting: Internal Medicine

## 2022-05-17 ENCOUNTER — Emergency Department
Admission: EM | Admit: 2022-05-17 | Discharge: 2022-05-18 | Disposition: A | Discharge status: Home / Self Care | Payer: Medicare Other | Attending: Emergency Medicine | Admitting: Emergency Medicine

## 2022-05-17 ENCOUNTER — Ambulatory Visit: Payer: Self-pay | Admitting: *Deleted

## 2022-05-17 ENCOUNTER — Other Ambulatory Visit: Payer: Self-pay

## 2022-05-17 ENCOUNTER — Emergency Department: Payer: Medicare Other

## 2022-05-17 DIAGNOSIS — Z20822 Contact with and (suspected) exposure to covid-19: Secondary | ICD-10-CM | POA: Insufficient documentation

## 2022-05-17 DIAGNOSIS — F0390 Unspecified dementia without behavioral disturbance: Secondary | ICD-10-CM | POA: Diagnosis not present

## 2022-05-17 DIAGNOSIS — N390 Urinary tract infection, site not specified: Secondary | ICD-10-CM | POA: Diagnosis not present

## 2022-05-17 DIAGNOSIS — R300 Dysuria: Secondary | ICD-10-CM | POA: Diagnosis present

## 2022-05-17 DIAGNOSIS — E860 Dehydration: Secondary | ICD-10-CM | POA: Insufficient documentation

## 2022-05-17 DIAGNOSIS — I10 Essential (primary) hypertension: Secondary | ICD-10-CM | POA: Diagnosis not present

## 2022-05-17 LAB — RESP PANEL BY RT-PCR (FLU A&B, COVID) ARPGX2
Influenza A by PCR: NEGATIVE
Influenza B by PCR: NEGATIVE
SARS Coronavirus 2 by RT PCR: NEGATIVE

## 2022-05-17 LAB — URINALYSIS, ROUTINE W REFLEX MICROSCOPIC
Bacteria, UA: NONE SEEN
Bilirubin Urine: NEGATIVE
Glucose, UA: NEGATIVE mg/dL
Hgb urine dipstick: NEGATIVE
Ketones, ur: NEGATIVE mg/dL
Leukocytes,Ua: NEGATIVE
Nitrite: NEGATIVE
Protein, ur: 100 mg/dL — AB
Specific Gravity, Urine: 1.013 (ref 1.005–1.030)
pH: 5 (ref 5.0–8.0)

## 2022-05-17 LAB — BASIC METABOLIC PANEL
Anion gap: 10 (ref 5–15)
BUN: 26 mg/dL — ABNORMAL HIGH (ref 8–23)
CO2: 22 mmol/L (ref 22–32)
Calcium: 8.9 mg/dL (ref 8.9–10.3)
Chloride: 105 mmol/L (ref 98–111)
Creatinine, Ser: 1.97 mg/dL — ABNORMAL HIGH (ref 0.61–1.24)
GFR, Estimated: 33 mL/min — ABNORMAL LOW (ref >60)
Glucose, Bld: 94 mg/dL (ref 70–99)
Potassium: 4.4 mmol/L (ref 3.5–5.1)
Sodium: 137 mmol/L (ref 135–145)

## 2022-05-17 LAB — CBC
HCT: 41.3 % (ref 39.0–52.0)
Hemoglobin: 13.6 g/dL (ref 13.0–17.0)
MCH: 27.2 pg (ref 26.0–34.0)
MCHC: 32.9 g/dL (ref 30.0–36.0)
MCV: 82.6 fL (ref 80.0–100.0)
Platelets: 204 10*3/uL (ref 150–400)
RBC: 5 MIL/uL (ref 4.22–5.81)
RDW: 14.1 % (ref 11.5–15.5)
WBC: 7.1 10*3/uL (ref 4.0–10.5)
nRBC: 0 % (ref 0.0–0.2)

## 2022-05-17 MED ORDER — LACTATED RINGERS IV BOLUS
1000.0000 mL | Freq: Once | INTRAVENOUS | Status: AC
  Administered 2022-05-17: 1000 mL via INTRAVENOUS

## 2022-05-17 NOTE — Telephone Encounter (Signed)
  Chief Complaint: weakness, weight loss, dark urine Symptoms: vomiting at times Frequency: getting worse over past week but to day the neurological symptoms started, staring, shaking hands Pertinent Negatives: Patient denies fever but is perspiring a lot Disposition: '[x]'$ ED /'[]'$ Urgent Care (no appt availability in office) / '[]'$ Appointment(In office/virtual)/ '[]'$  [facility]/ '[]'$ Home Care/ '[]'$ Refused Recommended Disposition /'[]'$ [facility]/ '[]'$  Follow-up with PCP Additional Notes: Pt to go to ED via EMS. Wife unable to assist him with ambulation at this point due to his weakness.   Reason for Disposition  [1] SEVERE weakness (i.e., unable to walk or barely able to walk, requires support) AND [2] new-onset or worsening  Answer Assessment - Initial Assessment Questions 1. DESCRIPTION: "Describe how you are feeling."     The pt stares, hands shake constantly, tongue looks abscessed also toes 2. SEVERITY: "How bad is it?"  "Can you stand and walk?"   - MILD - Feels weak or tired, but does not interfere with work, school or normal activities   - [place] to stand and walk; weakness interferes with work, school, or normal activities   - SEVERE - Unable to stand or walk     Unable to walk with assistance 3. ONSET:  "When did the weakness begin?"     Been loosing weight a while, hallucinations started yesterday 4. CAUSE: "What do you think is causing the weakness?"     Alzheimers 5. MEDICINES: "Have you recently started a new medicine or had a change in the amount of a medicine?"     Increased dose of Welbutrin one month ago 6. OTHER SYMPTOMS: "Do you have any other symptoms?" (e.g., chest pain, fever, cough, SOB, vomiting, diarrhea, bleeding, other areas of pain)     Vomits at times, perspiring a lot. No fever 7. PREGNANCY: "Is there any chance you are pregnant?" "When was your last menstrual period?"     no  Protocols used: Weakness (Generalized) and Fatigue-A-AH

## 2022-05-17 NOTE — ED Provider Notes (Signed)
[facility] Provider Note    Event Date/Time   First MD Initiated Contact with Patient 05/17/22 2043     (approximate)   History   Chief Complaint: Recurrent UTI   HPI  Robert Zamora is a 81 y.o. male with a history of BPH, hypertension, dementia, GERD who is brought to the ED by EMS due to strong smelling dark urine today, decreased energy level, wife concerned about UTI.  No falls or trauma, no fever.  Patient denies any pain.  No vomiting or diarrhea.         Physical Exam   Triage Vital Signs: ED Triage Vitals [05/17/22 1720]  Enc Vitals Group     BP (!) 150/83     Pulse Rate (!) 107     Resp 19     Temp 98.1 F (36.7 C)     Temp src      SpO2 95 %     Weight      Height      Head Circumference      Peak Flow      Pain Score      Pain Loc      Pain Edu?      Excl. in [place]?     Most recent vital signs: Vitals:   05/17/22 2300 05/17/22 2330  BP: (!) 146/79 (!) 159/88  Pulse: 63 60  Resp: 17 17  Temp:    SpO2: 100% 100%    General: Awake, no distress.  Interactive. CV:  Good peripheral perfusion.  Regular rate and rhythm, symmetric pulses Resp:  Normal effort.  Clear to auscultation bilaterally Abd:  No distention.  Soft and nontender. Other:  No lower extremity edema.  Dry mucous membranes   ED Results / Procedures / Treatments   Labs (all labs ordered are listed, but only abnormal results are displayed) Labs Reviewed  URINALYSIS, ROUTINE W REFLEX MICROSCOPIC - Abnormal; Notable for the following components:      Result Value   Color, Urine YELLOW (*)    APPearance CLEAR (*)    Protein, ur 100 (*)    All other components within normal limits  BASIC METABOLIC PANEL - Abnormal; Notable for the following components:   BUN 26 (*)    Creatinine, Ser 1.97 (*)    GFR, Estimated 33 (*)    All other components within normal limits  RESP PANEL BY RT-PCR (FLU A&B, COVID) ARPGX2  CBC     EKG    RADIOLOGY KUB  interpreted by me, negative for bowel obstruction.  Radiology report reviewed.   PROCEDURES:  Procedures   MEDICATIONS ORDERED IN ED: Medications  lactated ringers bolus 1,000 mL (0 mLs Intravenous Stopped 05/17/22 2247)     IMPRESSION / MDM / ASSESSMENT AND PLAN / ED COURSE  I reviewed the triage vital signs and the nursing notes.                              Differential diagnosis includes, but is not limited to, dehydration, AKI, electrolyte abnormality, UTI, urinary retention  Patient's presentation is most consistent with exacerbation of chronic illness.  Patient presents with decreased energy level today.  He does have chronic poor oral intake.  Encouraged spouse to provide nutritional supplements such as Ensure.  Labs here are reassuring, no evidence of retention.  Patient had about 400 mL in his bladder on In-N-Out cath, but without infection and  due to his dementia and high risk of spontaneously removing an indwelling catheter, will hold off on placing a Foley for now pending follow-up.  Work-up is reassuring, exam is nonfocal, vitals are unremarkable, stable for discharge      FINAL CLINICAL IMPRESSION(S) / ED DIAGNOSES   Final diagnoses:  Dehydration  Chronic dementia ([place])     Rx / DC Orders   ED Discharge Orders          Ordered    polyethylene glycol powder (GLYCOLAX/MIRALAX) 17 GM/SCOOP powder        05/18/22 0004             Note:  This document was prepared using Dragon voice recognition software and may include unintentional dictation errors.   Carrie Mew, MD 05/18/22 0010

## 2022-05-17 NOTE — ED Triage Notes (Signed)
Pt comes into the ED via EMS from home , wife reports pt was completed abx for UTI 2 weeks ago and is having cloudy, foul smelling urine, concerned about a recurrent UTI  Hx of dementia HR60 160/90 Temp 98.6 [id number]

## 2022-05-17 NOTE — ED Notes (Addendum)
Bladder scan showed  419m.

## 2022-05-18 ENCOUNTER — Encounter: Payer: Self-pay | Admitting: Internal Medicine

## 2022-05-18 ENCOUNTER — Telehealth: Payer: Self-pay | Admitting: Internal Medicine

## 2022-05-18 DIAGNOSIS — R296 Repeated falls: Secondary | ICD-10-CM

## 2022-05-18 DIAGNOSIS — F0280 Dementia in other diseases classified elsewhere without behavioral disturbance: Secondary | ICD-10-CM

## 2022-05-18 MED ORDER — POLYETHYLENE GLYCOL 3350 17 GM/SCOOP PO POWD: ORAL | 0 refills | Status: DC

## 2022-05-18 NOTE — Telephone Encounter (Signed)
Home Health Verbal Orders - Caller/Agency: Tiffany/ [facility]  Callback Number: [phone]  Requesting OT/PT/Skilled Nursing/Social Work/Speech Therapy: nursing  Frequency: 1w3

## 2022-05-18 NOTE — Telephone Encounter (Signed)
Verbal given 

## 2022-05-18 NOTE — Telephone Encounter (Signed)
*   see below*

## 2022-05-18 NOTE — Telephone Encounter (Signed)
Rep w/ [facility] checking status of verbal order  Advised verbal was given on 6-16, as noted below  Rep states they never received verbal  Please assist further

## 2022-05-18 NOTE — Telephone Encounter (Signed)
Vm left.

## 2022-05-19 ENCOUNTER — Encounter: Payer: Self-pay | Admitting: Internal Medicine

## 2022-05-21 ENCOUNTER — Other Ambulatory Visit: Payer: Medicare Other

## 2022-05-21 ENCOUNTER — Telehealth: Payer: Self-pay | Admitting: Internal Medicine

## 2022-05-21 ENCOUNTER — Other Ambulatory Visit: Payer: Medicare Other | Admitting: *Deleted

## 2022-05-21 VITALS — BP 157/87 | HR 60 | Temp 98.1°F | Resp 18 | Ht 75.0 in | Wt 209.0 lb

## 2022-05-21 DIAGNOSIS — Z515 Encounter for palliative care: Secondary | ICD-10-CM

## 2022-05-21 DIAGNOSIS — F0150 Vascular dementia without behavioral disturbance: Secondary | ICD-10-CM

## 2022-05-21 NOTE — Telephone Encounter (Signed)
Monisha with [facility] called asking if Teodora Medici would order hospice care for the patient because of rapid decline.  Also they want ot know if Grayland Ormond would be his hospice provider to sign his orders.  CB#  [phone]

## 2022-05-21 NOTE — Progress Notes (Signed)
COMMUNITY PALLIATIVE CARE SW NOTE  PATIENT NAME: Robert Zamora DOB: 11/19/1940 MRN: [id number]  PRIMARY CARE PROVIDER: Teodora Medici, DO  RESPONSIBLE PARTY:  Acct ID - Guarantor Home Phone Work Phone Relationship Acct Type  [id number] - Robert Zamora,Robert Zamora [phone]  Self P/F     [place], [place], [place] [address]     PLAN OF CARE and INTERVENTIONS:               GOALS OF CARE/ ADVANCE CARE PLANNING: Goals include to maximize quality of life and symptom management. Our advance care planning conversation included a discussion about:    The value and importance of advance care planning  Review and updating or creation of an advance directive document.  Code Status: Patient is a DNR.   2.    Palliative care encounter: Palliative medicine team will continue to support patient, patient's family, and medical team. Visit consisted of counseling and education dealing with the complex and emotionally intense issues of symptom management and palliative care in the setting of serious and potentially life-threatening illness. SW completed visit with [facility] RN M. Nadara Mustard, met with patient and patients spouse. Patients caregiver, Keane Scrape present during visit as well.  Functional changes/updates: Patient has had a decline since previous PC visit. Patient received laying in bed. Wife provided update on patient changes.  Alzheimer's with Lewy body - Hallucinations: new behavior within past 2 weeks of auditory and visual hallucinations of interacting with young children. Tremors: patient having more hand tremors.  ADLS: patient is total care/assist with ADL's to include feeding. Patient is mostly bed bound.  Pain: none reported  Appetite: is poor. Patient has had episodes of nausea and vomiting. Spouse isn ow crushing meds for patient. Current weight is 209.2lbs. Spouse and caregiver share that this is a nearly 7 lb decrease over the past 3 weeks. Per spouse patiens weight was around 230lbs three  months ago.  Hospice: Hospice services discussed and reviewed with spouse. Patient has declined significantly for a hospice services to be considered for care and support. Spouse in agreement with hospice referral being made for a hospice assessment to be done. PC will outreach PCP office for collaboration and hospice order.  Psychosocial assessment: completed.   In-Home support: Spouse is primary caregiver, however Patient is receiving 4 hrs/day with [facility] covered by LTC policy.  Transportation: Spouse is unable to get patient to doctors appointment.  Food insecurity: No needs.   Safety: no concerns.  Long term goal: patient desires to remain in his home. However LTC SNF placement discussed with spouse previously. Patient anticipated to transition to Hospice services in the home.  Ongoing support/resources will continued to be offered if needed.   SW discussed goals, reviewed care plan, provided emotional support, used active and reflective listening in the form of reciprocity emotional response. Questions and concerns were addressed. The patient/family was encouraged to call with any additional questions and/or concerns. PC Provided general support and encouragement, no other unmet needs identified. Will continue to follow.  3.         PATIENT/CAREGIVER EDUCATION/ COPING:   Appearance: well groomed, appropriate given situation  Mental Status: alert and oriented to self Eye Contact: good Speech: Normal rate, low volume, tone  Mood: Normal and calm Affect: Congruent to endorsed mood, full ranging Interaction Style: Cooperative  Patient A&O and able to make needs known. Patient actively engaged in conversation and answer simple questions. Patient able to answer questions about hx of life background semi-accurately. PHQ-9 not  assessed. Per spouse and caregiver patients ability to engage in fluent conversation fluctuates day-to-day. Patient enjoys listening to music and watching  TV.   Family is supportive and provides physical and emotional support to patient.   4.         PERSONAL EMERGENCY PLAN:  family will call 9-1-1 for emergencies.    5.         COMMUNITY RESOURCES COORDINATION/ HEALTH CARE NAVIGATION:  spouse manages his care.  6.         FINANCIAL CONCERNS/NEEDS: None.   Primary Health Insurance: Medicare Secondary Health Insurance: AARP  Prescription Coverage: Yes, no history of difficulty obtaining or affording prescriptions reported  SOCIAL HX:  Social History   Tobacco Use   Smoking status: Former    Passive exposure: Past   Smokeless tobacco: Never   Tobacco comments:    Quit 1980's   Substance Use Topics   Alcohol use: Never    CODE STATUS: DNR ADVANCED DIRECTIVES: N MOST FORM COMPLETE: N HOSPICE EDUCATION PROVIDED: Y   PPS: Patient is total assist with all ADL's.   Time spent: 1 hr.      Doreene Eland, LCSW

## 2022-05-21 NOTE — Progress Notes (Signed)
[facility] COMMUNITY PALLIATIVE CARE RN NOTE  PATIENT NAME: Robert Zamora DOB: 07-15-1941 MRN: [id number]  PRIMARY CARE PROVIDER: Teodora Medici, DO  RESPONSIBLE PARTY: Randell Loop (wife) Acct ID - Guarantor Home Phone Work Phone Relationship Acct Type  [id number] Robert Zamora [phone]  Self P/F     [address], [address], [place] [address]   RN/SW follow up visit completed with patient, wife and hired caregiver Jess in their home. Patient has had a significant cognitive and functional decline over the past month.  Patient has a diagnosis of mixed Alzheimer's and Vascular dementia. 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  Cognitive: He has days where he is able to complete sentences and other days speech is minimal, garbled and unintelligible. He is experiencing visual and auditory hallucinations e.g seeing children in the home, talking with them and asking where they are in the home. Worsening short term memory.  Mobility: Impaired mobility and balance. Fell 2 weeks ago 3 times in the same week. No apparent injuries. Required fire dept to get him up 2 out of the 3 times. About 2 months ago he was ambulating with his upright walker 30-45 minutes outside and around the house. Now he requires 1 person assistance w/gait belt to stand/transfer, and caregiver has to dress and shower him. He is only able to take a few steps and can't stand over a few seconds. He has increasing tremors in legs. Initially started in right leg, now left leg affected. Also unable to feed self due to hand tremors. Increased truncal weakness and leans to the left when sitting in wheelchair.   Appetite: Decreased intake. Eating about 1 to 1 1/2 meals/day. 50% less than what he used to. Poor fluid intake. Wife is trying to push fluids. Difficulty swallowing pills and wife now has to crush them. He has lost 7 lbs over the past 3 weeks and 21 lbs over the past month. Current weight  today 209 lbs. He has also had some recent episodes of nausea/vomiting.  GI/GU: Incontinent of both bowel and bladder. Wears condom catheter. LBM yesterday 05/20/22. Has history of chronic UTIs. Has completed two 7 day courses of antibiotics over the past month. Urine dark with foul odor.  Skin: Skin is very dark in ankles/feet. Poor circulation and cool to touch. Darkness used to extend all the way up to knees but by using compression stockings daily and elevation, from the ankles up have improved. Pressure injuries noted to the top of both great toes. Blackened are noted underneath right great toe wife says has been there for many years. No open areas.  Hospice education provided. Wife wants to proceed with hospice for patient. I called Dr. Rosana Berger office and requested an order for a hospice consult.   PHYSICAL EXAM:   VITALS: Today's Vitals   05/21/22 1357  BP: (!) 157/87  Pulse: 60  Resp: 18  Temp: 98.1 F (36.7 C)  TempSrc: Temporal  SpO2: 97%  Weight: 209 lb (94.8 kg)  Height: '6\' 3"'$  (1.905 m)  PainSc: 0-No pain     Daryl Eastern, RN BSN

## 2022-05-24 ENCOUNTER — Encounter: Payer: Self-pay | Admitting: Oncology

## 2022-05-24 ENCOUNTER — Encounter: Payer: Self-pay | Admitting: Podiatry

## 2022-05-24 ENCOUNTER — Encounter (INDEPENDENT_AMBULATORY_CARE_PROVIDER_SITE_OTHER): Payer: Self-pay | Admitting: Vascular Surgery

## 2022-05-24 ENCOUNTER — Encounter (INDEPENDENT_AMBULATORY_CARE_PROVIDER_SITE_OTHER): Payer: Self-pay

## 2022-05-24 ENCOUNTER — Encounter: Payer: Self-pay | Admitting: Internal Medicine

## 2022-05-24 NOTE — Telephone Encounter (Signed)
Per Dr Delana Meyer reschedule pt's appt for a month or so out, since lymph pump has not arrived.

## 2022-05-24 NOTE — Telephone Encounter (Signed)
Per Dr Delana Meyer can we find out what the problem is with this pt getting a lymph pump? Pt states that he has been measured but has not received the pump.

## 2022-05-24 NOTE — Telephone Encounter (Signed)
Please advise 

## 2022-05-24 NOTE — Telephone Encounter (Signed)
Please cancel all upcoming appointment

## 2022-05-25 NOTE — Telephone Encounter (Signed)
Thanks

## 2022-05-26 ENCOUNTER — Ambulatory Visit: Payer: Medicare Other | Admitting: Internal Medicine

## 2022-05-28 ENCOUNTER — Ambulatory Visit (INDEPENDENT_AMBULATORY_CARE_PROVIDER_SITE_OTHER): Payer: Medicare Other | Admitting: Nurse Practitioner

## 2022-05-28 ENCOUNTER — Encounter (INDEPENDENT_AMBULATORY_CARE_PROVIDER_SITE_OTHER): Payer: Medicare Other

## 2022-05-28 NOTE — Telephone Encounter (Signed)
Dr Delana Meyer wants [place] to call [employer] to see what the problem is.  I sent a message to Mickel Baas to have her call and get more information.

## 2022-06-07 NOTE — Telephone Encounter (Signed)
Mickel Baas left a message with Catalina Antigua  at [employer]  on 05/28/2022

## 2022-06-09 ENCOUNTER — Other Ambulatory Visit: Payer: Medicare Other | Admitting: Student

## 2022-06-23 ENCOUNTER — Ambulatory Visit: Payer: Medicare Other | Admitting: Internal Medicine

## 2022-06-24 ENCOUNTER — Ambulatory Visit: Payer: Medicare Other | Admitting: Podiatry

## 2022-07-08 ENCOUNTER — Ambulatory Visit: Payer: Medicare Other | Admitting: Podiatry

## 2022-09-06 ENCOUNTER — Encounter (INDEPENDENT_AMBULATORY_CARE_PROVIDER_SITE_OTHER): Payer: Self-pay

## 2022-09-15 ENCOUNTER — Other Ambulatory Visit: Payer: Medicare Other

## 2022-09-22 ENCOUNTER — Ambulatory Visit: Payer: Medicare Other | Admitting: Oncology

## 2022-09-27 ENCOUNTER — Other Ambulatory Visit: Payer: Self-pay | Admitting: Internal Medicine

## 2022-09-27 DIAGNOSIS — F332 Major depressive disorder, recurrent severe without psychotic features: Secondary | ICD-10-CM

## 2022-09-28 NOTE — Telephone Encounter (Signed)
Requested Prescriptions  Pending Prescriptions Disp Refills   buPROPion (WELLBUTRIN XL) 150 MG 24 hr tablet [Pharmacy Med Name: BUPROPION XL '150MG'$  TABLETS (24 H)] 90 tablet 0    Sig: TAKE 1 TABLET(150 MG) BY MOUTH DAILY     Psychiatry: Antidepressants - bupropion Failed - 09/27/2022  1:52 PM      Failed - Cr in normal range and within 360 days    Creatinine, Ser  Date Value Ref Range Status  05/17/2022 1.97 (H) 0.61 - 1.24 mg/dL Final         Failed - Last BP in normal range    BP Readings from Last 1 Encounters:  05/21/22 (!) 157/87         Passed - AST in normal range and within 360 days    AST  Date Value Ref Range Status  04/23/2022 26 15 - 41 U/L Final         Passed - ALT in normal range and within 360 days    ALT  Date Value Ref Range Status  04/23/2022 28 0 - 44 U/L Final         Passed - Completed PHQ-2 or PHQ-9 in the last 360 days      Passed - Valid encounter within last 6 months    Recent Outpatient Visits           5 months ago Mixed Alzheimer's and vascular dementia [facility])   [place], [place]   5 months ago Urinary incontinence without sensory awareness   [place], [place]

## 2022-10-04 ENCOUNTER — Ambulatory Visit (INDEPENDENT_AMBULATORY_CARE_PROVIDER_SITE_OTHER): Payer: Medicare Other | Admitting: Vascular Surgery

## 2023-01-07 DEATH — deceased

## 2023-01-26 ENCOUNTER — Ambulatory Visit: Payer: Medicare Other | Admitting: Urology
